# Patient Record
Sex: Female | Born: 1950 | Race: White | Hispanic: No | Marital: Married | Smoking: Never smoker
Health system: Southern US, Community
[De-identification: ages and names within clinical notes are randomized; demographics above are authoritative.]

## PROBLEM LIST (undated history)

## (undated) DIAGNOSIS — R197 Diarrhea, unspecified: Secondary | ICD-10-CM

## (undated) DIAGNOSIS — F329 Major depressive disorder, single episode, unspecified: Secondary | ICD-10-CM

## (undated) DIAGNOSIS — K219 Gastro-esophageal reflux disease without esophagitis: Secondary | ICD-10-CM

## (undated) DIAGNOSIS — E785 Hyperlipidemia, unspecified: Secondary | ICD-10-CM

## (undated) DIAGNOSIS — R059 Cough, unspecified: Secondary | ICD-10-CM

## (undated) DIAGNOSIS — F32A Depression, unspecified: Secondary | ICD-10-CM

## (undated) DIAGNOSIS — I10 Essential (primary) hypertension: Secondary | ICD-10-CM

## (undated) DIAGNOSIS — R05 Cough: Secondary | ICD-10-CM

## (undated) DIAGNOSIS — R42 Dizziness and giddiness: Secondary | ICD-10-CM

## (undated) DIAGNOSIS — I739 Peripheral vascular disease, unspecified: Secondary | ICD-10-CM

## (undated) DIAGNOSIS — F419 Anxiety disorder, unspecified: Secondary | ICD-10-CM

## (undated) HISTORY — DX: Cough: R05

## (undated) HISTORY — DX: Hyperlipidemia, unspecified: E78.5

## (undated) HISTORY — PX: SEPTOPLASTY: SUR1290

## (undated) HISTORY — PX: LUMBAR SPINE SURGERY: SHX701

## (undated) HISTORY — DX: Essential (primary) hypertension: I10

## (undated) HISTORY — DX: Diarrhea, unspecified: R19.7

## (undated) HISTORY — DX: Cough, unspecified: R05.9

## (undated) HISTORY — PX: TOTAL ABDOMINAL HYSTERECTOMY: SHX209

## (undated) HISTORY — DX: Peripheral vascular disease, unspecified: I73.9

## (undated) HISTORY — DX: Gastro-esophageal reflux disease without esophagitis: K21.9

## (undated) HISTORY — PX: RHINOPLASTY: SUR1284

## (undated) HISTORY — PX: CHOLECYSTECTOMY: SHX55

---

## 1999-04-28 ENCOUNTER — Other Ambulatory Visit: Admission: RE | Admit: 1999-04-28 | Discharge: 1999-04-28 | Payer: Self-pay | Admitting: Gynecology

## 2000-05-31 ENCOUNTER — Encounter: Admission: RE | Admit: 2000-05-31 | Discharge: 2000-05-31 | Payer: Self-pay | Admitting: *Deleted

## 2000-07-19 ENCOUNTER — Other Ambulatory Visit: Admission: RE | Admit: 2000-07-19 | Discharge: 2000-07-19 | Payer: Self-pay | Admitting: Gynecology

## 2000-09-24 ENCOUNTER — Inpatient Hospital Stay (HOSPITAL_COMMUNITY): Admission: RE | Admit: 2000-09-24 | Discharge: 2000-09-27 | Payer: Self-pay | Admitting: Gynecology

## 2000-09-24 ENCOUNTER — Encounter (INDEPENDENT_AMBULATORY_CARE_PROVIDER_SITE_OTHER): Payer: Self-pay | Admitting: Specialist

## 2002-07-25 ENCOUNTER — Encounter: Payer: Self-pay | Admitting: Gastroenterology

## 2002-07-25 ENCOUNTER — Encounter: Admission: RE | Admit: 2002-07-25 | Discharge: 2002-07-25 | Payer: Self-pay | Admitting: Internal Medicine

## 2002-08-25 ENCOUNTER — Encounter: Payer: Self-pay | Admitting: General Surgery

## 2002-08-29 ENCOUNTER — Encounter (INDEPENDENT_AMBULATORY_CARE_PROVIDER_SITE_OTHER): Payer: Self-pay | Admitting: *Deleted

## 2002-08-29 ENCOUNTER — Observation Stay (HOSPITAL_COMMUNITY): Admission: RE | Admit: 2002-08-29 | Discharge: 2002-08-30 | Payer: Self-pay | Admitting: General Surgery

## 2002-08-29 ENCOUNTER — Encounter: Payer: Self-pay | Admitting: General Surgery

## 2002-09-02 ENCOUNTER — Encounter: Admission: RE | Admit: 2002-09-02 | Discharge: 2002-09-02 | Payer: Self-pay | Admitting: General Surgery

## 2002-09-02 ENCOUNTER — Encounter: Payer: Self-pay | Admitting: General Surgery

## 2003-04-23 ENCOUNTER — Other Ambulatory Visit: Admission: RE | Admit: 2003-04-23 | Discharge: 2003-04-23 | Payer: Self-pay | Admitting: Gynecology

## 2004-04-19 ENCOUNTER — Ambulatory Visit: Payer: Self-pay | Admitting: Internal Medicine

## 2004-05-17 ENCOUNTER — Ambulatory Visit: Payer: Self-pay | Admitting: Internal Medicine

## 2004-11-28 ENCOUNTER — Ambulatory Visit: Payer: Self-pay | Admitting: Internal Medicine

## 2005-04-21 ENCOUNTER — Ambulatory Visit: Payer: Self-pay | Admitting: Internal Medicine

## 2006-01-16 ENCOUNTER — Ambulatory Visit: Payer: Self-pay | Admitting: Internal Medicine

## 2006-02-15 ENCOUNTER — Ambulatory Visit: Payer: Self-pay | Admitting: Internal Medicine

## 2007-07-05 ENCOUNTER — Ambulatory Visit: Payer: Self-pay | Admitting: Internal Medicine

## 2007-07-05 DIAGNOSIS — J309 Allergic rhinitis, unspecified: Secondary | ICD-10-CM | POA: Insufficient documentation

## 2007-07-05 DIAGNOSIS — J45909 Unspecified asthma, uncomplicated: Secondary | ICD-10-CM | POA: Insufficient documentation

## 2007-07-05 DIAGNOSIS — R059 Cough, unspecified: Secondary | ICD-10-CM | POA: Insufficient documentation

## 2007-07-05 DIAGNOSIS — R05 Cough: Secondary | ICD-10-CM | POA: Insufficient documentation

## 2007-07-05 DIAGNOSIS — L509 Urticaria, unspecified: Secondary | ICD-10-CM | POA: Insufficient documentation

## 2007-07-05 DIAGNOSIS — J209 Acute bronchitis, unspecified: Secondary | ICD-10-CM | POA: Insufficient documentation

## 2008-01-02 ENCOUNTER — Ambulatory Visit: Payer: Self-pay | Admitting: Internal Medicine

## 2008-03-19 ENCOUNTER — Ambulatory Visit: Payer: Self-pay | Admitting: Internal Medicine

## 2008-03-24 ENCOUNTER — Telehealth: Payer: Self-pay | Admitting: Internal Medicine

## 2008-03-26 ENCOUNTER — Telehealth (INDEPENDENT_AMBULATORY_CARE_PROVIDER_SITE_OTHER): Payer: Self-pay | Admitting: *Deleted

## 2008-03-27 ENCOUNTER — Ambulatory Visit: Payer: Self-pay | Admitting: Internal Medicine

## 2008-04-03 ENCOUNTER — Telehealth (INDEPENDENT_AMBULATORY_CARE_PROVIDER_SITE_OTHER): Payer: Self-pay | Admitting: *Deleted

## 2008-10-06 ENCOUNTER — Ambulatory Visit: Payer: Self-pay | Admitting: Internal Medicine

## 2008-10-22 ENCOUNTER — Ambulatory Visit: Payer: Self-pay | Admitting: Internal Medicine

## 2008-10-22 DIAGNOSIS — H6980 Other specified disorders of Eustachian tube, unspecified ear: Secondary | ICD-10-CM | POA: Insufficient documentation

## 2008-10-22 DIAGNOSIS — H6990 Unspecified Eustachian tube disorder, unspecified ear: Secondary | ICD-10-CM | POA: Insufficient documentation

## 2009-08-31 ENCOUNTER — Ambulatory Visit: Payer: Self-pay | Admitting: Internal Medicine

## 2009-09-03 ENCOUNTER — Telehealth (INDEPENDENT_AMBULATORY_CARE_PROVIDER_SITE_OTHER): Payer: Self-pay | Admitting: *Deleted

## 2009-11-01 ENCOUNTER — Telehealth (INDEPENDENT_AMBULATORY_CARE_PROVIDER_SITE_OTHER): Payer: Self-pay | Admitting: *Deleted

## 2010-01-06 ENCOUNTER — Ambulatory Visit: Payer: Self-pay | Admitting: Internal Medicine

## 2010-01-06 DIAGNOSIS — K219 Gastro-esophageal reflux disease without esophagitis: Secondary | ICD-10-CM | POA: Insufficient documentation

## 2010-03-04 ENCOUNTER — Telehealth (INDEPENDENT_AMBULATORY_CARE_PROVIDER_SITE_OTHER): Payer: Self-pay | Admitting: *Deleted

## 2010-03-09 ENCOUNTER — Telehealth: Payer: Self-pay | Admitting: Internal Medicine

## 2010-04-14 NOTE — Progress Notes (Signed)
Summary: talk to nurse  Phone Note Call from Patient Call back at Home Phone [phone]   Caller: Patient Call For: young Summary of Call: States she's not doing any better, would like to have prednisone called in.//[facility] Initial call taken by: Darletta Moll,  September 03, 2009 9:40 AM  Follow-up for Phone Call        Spoke with pt.  She was last seen 6/21 and given rx for amoxcicillin 500 two times a day x 7 days.  She states that her cough is no better- still prod with yellow sputum.  She also c/o the atrovent inhaler makes her throat burn.  She states that she was switched to this d/t xopenex hfs causing nervousness.  She is using the xopenex for neb without any problems.  She wonders if she needs prednisone taper called in.  Please advise, thanks!  Additional Follow-up for Phone Call Additional follow up Details #1::        spoke with pt informed of Prednisone rx -[facility] .Kandice Hams [facility]  September 03, 2009 11:31 AM  Additional Follow-up by: Kandice Hams CMA,  September 03, 2009 11:31 AM    New/Updated Medications: PREDNISONE 10 MG TABS (PREDNISONE) 4 by mouth for 2 days,then 3 by mouth for 2 days,then 2 by mouth for2 days, then 1 by mouth for 2 days Prescriptions: PREDNISONE 10 MG TABS (PREDNISONE) 4 by mouth for 2 days,then 3 by mouth for 2 days,then 2 by mouth for2 days, then 1 by mouth for 2 days  #20 x 0   Entered by:   Kandice Hams CMA   Authorized by:   Waymon Budge MD   Signed by:   Kandice Hams CMA on 09/03/2009   Method used:   Faxed to ...       [facility] [address] (retail)       [address]       [place], [place]  [address]       Ph: [phone] or [phone]       Fax: [phone]   RxID:   [id number]

## 2010-04-14 NOTE — Progress Notes (Signed)
Summary: prescription  Phone Note Call from Patient Call back at Home Phone [phone]   Caller: Patient Call For: DR. Maple Hudson Summary of Call: Pt phoned she was exposed to leaf mold earlier this week and now her face and sinus are swollen she wants to know if Dr. Maple Hudson would call her in some prednisone she has an antibiotic that he gave her at her last visit to fill when she needed it. [facility] [phone] she can be reached at [phone] Initial call taken by: Vedia Coffer,  March 04, 2010 11:46 AM  Follow-up for Phone Call        called spoke with patient who states that leaves/grass were being mowed at her work 3 days ago, and since has been having sinsu pressure, facial swelling.  had rx for zpak given at 10.27.11 ov and had this filled - took first 2 earlier today.  pt is requesting prednisone.  please advise, thanks!  [facility]  Allergies:  1)  ! Flagyl 2)  ! Sulfa 3)  ! Levaquin 4)  ! Augmentin 5)  ! Zantac 6)  ! Nsaids 7)  ! * Nexium Follow-up by: Boone Master CNA/MA,  March 04, 2010 11:54 AM  Additional Follow-up for Phone Call Additional follow up Details #1::        Per CDY, prednisone 10mg  #20 x0.  4 tablets x 2 days, 3 tablets x 2 days, 2 tablets x 2 days, 1 tablet x 2 days.   Gweneth Dimitri RN  March 04, 2010 12:27 PM  Called, spoke with pt.  She is aware of above recs per CY and aware [facility]  She verbalized understanding.  Additional Follow-up by: Gweneth Dimitri RN,  March 04, 2010 12:29 PM    New/Updated Medications: PREDNISONE 10 MG TABS (PREDNISONE) 4 tablets x 2 days, 3 tablets x 2 days, 2 tablets x 2 days, 1 tablet x 2 days. Prescriptions: PREDNISONE 10 MG TABS (PREDNISONE) 4 tablets x 2 days, 3 tablets x 2 days, 2 tablets x 2 days, 1 tablet x 2 days.  #20 x 0   Entered by:   Gweneth Dimitri RN   Authorized by:   Waymon Budge MD   Signed by:   Gweneth Dimitri RN on 03/04/2010   Method used:    Electronically to        [facility] [phone]* (retail)       [address]       [place], [place]  [address]       Ph: [phone] or [phone]       Fax: [phone]   RxID:   [id number]

## 2010-04-14 NOTE — Assessment & Plan Note (Signed)
Summary: lung infection/ mbw   Primary Provider/Referring Provider:  Illa Level  CC:  c/o congestion, cough with production, and yellow took some Amoxicillin that was given by back surgeon. pt says xopenne hfa makes her nervous.  History of Present Illness: 03/19/08 Asthmatic bronchitis, rhinitis 1 month malaise. Past week tighter with cough. Intolerant of cold air. Started her nebulizer- burned her throat. Few green plugs. Burning substernal soreness. Denies fever, GI upset, myyalgias. Has seasonal flu vax. 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 Asks letter for employer.Taking mucinex.   October 06, 2008-- Presents for an acute office visit. Complains of eyes swollen, right ear pain with "drumming", prod cough with small amount of yellow mucus onset 08-24-08 after spinal surgery. Nasal congesiton, worse cough at night. OTC not helping. Denies chest pain, dyspnea, orthopnea, hemoptysis, fever, n/v/d, edema, headache.   10/22/08- Asthmatic bronchitis, rhinitis Had lumbar spine surgery. In late June as she began walking outdoors then, she noted swelling in ears, sense that it comes and goes and burns as it drains. Gluey postnasal drip. She wants something other than cortisone to dry her ears. Omnaris may have irritated her nose. Z pak added to stomach upset. Prednisone also irritated. Discussed probiotics.  August 31, 2009- Asthmatic bronchitis, rhinitis For 2 months has been aware of cough with activity especially outdoors. At  her school graduation she had to leave, feeling faint. Blames postnasal drainage for burning substernal discomfort, esophageal tightness. She took left over amoxacillin two times a day x last 2 days, then took 4 tabs today- 400 mg tabs. She had this anticipating dental work. Blames seasonal pollens and then air pollution, not a cold. Denies GI upset. using loratadine. uses her nebulizer. Even Xopenex HFA makes her jittery but she says she tolerates same med by  nebulizer. Takes back pain med and we discused overlap with cough syrup.    Asthma History    Initial Asthma Severity Rating:    Age range: 12+ years    Symptoms: 0-2 days/week    Nighttime Awakenings: 0-2/month    Interferes w/ normal activity: no limitations    SABA use (not for EIB): 0-2 days/week    Asthma Severity Assessment: Intermittent   Preventive Screening-Counseling & Management  Alcohol-Tobacco     Smoking Status: never  Allergies (verified): 1)  ! Flagyl 2)  ! Sulfa 3)  ! Levaquin 4)  ! Augmentin 5)  ! Zantac 6)  ! Nsaids  Past History:  Past Medical History: Last updated: 07/05/2007 COUGH (ICD-786.2) URTICARIA (ICD-708.9) ASTHMA (ICD-493.90) ALLERGIC RHINITIS (ICD-477.9)    Past Surgical History: Last updated: 03/19/2008 rhinoplasty/ septoplasty lumbar spine surgery x 2 Cholecystectomy Total Abdominal Hysterectomy  Family History: Last updated: 01/02/2008 Brother - hx PE  Social History: Last updated: 03/19/2008 Married Patient never smoked. 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  The patient denies shortness of breath with activity, shortness of breath at rest, coughing up blood, irregular heartbeats, acid heartburn, loss of appetite, weight change, abdominal pain, difficulty swallowing, tooth/dental problems, and headaches.    Vital Signs:  Patient profile:   60 year old female Height:      63.5 inches Weight:      210 pounds BMI:  36.75 O2 Sat:      96 % on Room air Temp:     96.7 degrees F oral Pulse rate:   76 / minute BP sitting:   130 / 70  Vitals Entered By: Kandice Hams (August 31, 2009 1:30 PM)  O2 Flow:  Room air  Physical Exam  Additional Exam:  General: A/Ox3; pleasant and cooperative,  NAD, SKIN: no rash, lesions NODES: no lymphadenopathy HEENT: [place]/AT, EOM- WNL, Conjuctivae- clear, PERRLA, TM-WNL, Nose- clear, Throat- clear and wnl., Mallampati III NECK: Supple w/ fair ROM, JVD- none, normal carotid impulses w/o bruits Thyroid- CHEST: Clear to P&A, unlabored, no cough or wheeze HEART: RRR, no m/g/r heard ABDOMEN: Soft and nl;  ZOX:WRUE, nl pulses, no edema  NEURO: Grossly intact to observation      Impression & Recommendations:  Problem # 1:  ALLERGIC RHINITIS (ICD-477.9)  She is describing a burning postnasal drip, possibly with a tracheitis. We will extend the amoxacillin since she used up her own supply. Her updated medication list for this problem includes:    Alavert 10 Mg Tabs (Loratadine) .Marland Kitchen... Take 1 tablet by mouth once a day as needed    Benadryl 25 Mg Caps (Diphenhydramine hcl) .Marland Kitchen... Take 1 tablet by mouth once a day as needed  Problem # 2:  ASTHMA (ICD-493.90) Not actively wheezing or coughing now. I agreed to refill her cough syrup as long as she is cautious about combining it with back pain meds.  She claims overstimulation even by Xopenex. I will let her try Atrovent as as rescue inhaler. Restart Singulair as a maintenance antiinflammatory. Refill cough syrup for occasional use.  Problem # 3:  URTICARIA (ICD-708.9) No recent rash, hives or puritus  Medications Added to Medication List This Visit: 1)  Atrovent Hfa 17 Mcg/act Aers (Ipratropium bromide hfa) .... 2 puffs four times a day as needed rescue inhaler 2)  Amoxicillin 500 Mg Caps (Amoxicillin) .Marland Kitchen.. 1 bid 3)  Singulair 10 Mg Tabs (Montelukast sodium) .Marland Kitchen.. 1 daily 4)  Hydromet 5-1.5 Mg/9ml Syrp (Hydrocodone-homatropine) .Marland Kitchen.. 1 teaspoon four times a day as needed cough  Other Orders: Est. Patient Level IV ([address])  Patient Instructions: 1)  Please schedule a follow-up appointment in 6 months. 2)  Script for amoxacillin, cough syrup, singulair 3)  Script to change rescue inhaler from  Xopenex to Atrovent HFA Prescriptions: HYDROMET 5-1.5 MG/5ML SYRP (HYDROCODONE-HOMATROPINE) 1 teaspoon four times a day as needed cough  #200 ml x 0   Entered and Authorized by:   Waymon Budge MD   Signed by:   Waymon Budge MD on 08/31/2009   Method used:   Print then Give to Patient   RxID:   [id number] SINGULAIR 10 MG TABS (MONTELUKAST SODIUM) 1 daily  #30 x prn   Entered and Authorized by:   Waymon Budge MD   Signed by:   Waymon Budge MD on 08/31/2009   Method used:   Print then Give to Patient   RxID:   [id number] AMOXICILLIN 500 MG CAPS (AMOXICILLIN) 1 bid  #14 x 0   Entered and Authorized by:   Waymon Budge MD   Signed by:   Waymon Budge MD on 08/31/2009   Method used:   Electronically to        [facility] [phone][facility] (retail)       [address]       [place], [place]  [address]       Ph:  [phone] or [phone]       Fax: [phone]   RxID:   [id number] ATROVENT HFA 17 MCG/ACT AERS (IPRATROPIUM BROMIDE HFA) 2 puffs four times a day as needed rescue inhaler  #1 x prn   Entered and Authorized by:   Waymon Budge MD   Signed by:   Waymon Budge MD on 08/31/2009   Method used:   Electronically to        [facility] [phone]* (retail)       [address]       [place], [place]  [address]       Ph: [phone] or [phone]       Fax: [phone]   RxID:   [id number]

## 2010-04-14 NOTE — Progress Notes (Signed)
Summary: refill-  Phone Note Call from Patient Call back at [phone]   Caller: Patient Call For: young Summary of Call: needs refill on promethazine codein syrup hit this has falling off of her refill list says it helps with motion sickness  [facility] [phone] Initial call taken by: Lacinda Axon,  March 09, 2010 12:09 PM  Follow-up for Phone Call        lmomtcb Vernie Murders  March 09, 2010 12:17 PM   Additional Follow-up for Phone Call Additional follow up Details #1::        Patient returned Leslie's call and will await call back to cell phone [phone]. Additional Follow-up by: Leonette Monarch,  March 09, 2010 12:46 PM    Additional Follow-up for Phone Call Additional follow up Details #2::    Pt c/o motion sickness when driving in car. Requesting Prometh Cod to be called in to pharmacy. States CY has prescribed this in the past for the same thing and is the only thing that helps with it. Please advise. Thanks! Zackery Barefoot CMA  March 10, 2010 12:28 PM  Call back [phone], [facility] Allergies:  1)  ! Flagyl 2)  ! Sulfa 3)  ! Levaquin 4)  ! Augmentin 5)  ! Zantac 6)  ! Nsaids 7)  ! * Nexium  Additional Follow-up for Phone Call Additional follow up Details #3:: Details for Additional Follow-up Action Taken: ok to give prometh/ codeine cough syrup 200 ml, no ref 1 tespoon four times a day as needed  Additional Follow-up by: Waymon Budge MD,  March 10, 2010 1:25 PM  New/Updated Medications: PROMETHAZINE-CODEINE 6.25-10 MG/5ML SYRP (PROMETHAZINE-CODEINE) 1 teaspoon every 4 hours as needed Prescriptions: PROMETHAZINE-CODEINE 6.25-10 MG/5ML SYRP (PROMETHAZINE-CODEINE) 1 teaspoon every 4 hours as needed  #212ml x 0   Entered by:   Carron Curie CMA   Authorized by:   Waymon Budge MD   Signed by:   Carron Curie CMA on 03/10/2010   Method used:   Telephoned to ...       [facility] [address] (retail)       [address]       [place], [place]  [address]       Ph: [phone] or [phone]       Fax: [phone]   RxID:   [id number]  refill sent. pt aware.Carron Curie CMA  March 10, 2010 1:41 PM

## 2010-04-14 NOTE — Assessment & Plan Note (Signed)
Summary: allergies///kp   Primary Provider/Referring Provider:  Illa Level  CC:  Acute visit-Sore throat, chest is sore, and cough-deep and dry..  History of Present Illness: 10/22/08- Asthmatic bronchitis, rhinitis Had lumbar spine surgery. In late June as she began walking outdoors then, she noted swelling in ears, sense that it comes and goes and burns as it drains. Gluey postnasal drip. She wants something other than cortisone to dry her ears. Omnaris may have irritated her nose. Z pak added to stomach upset. Prednisone also irritated. Discussed probiotics.  August 31, 2009- Asthmatic bronchitis, rhinitis For 2 months has been aware of cough with activity especially outdoors. At  her school graduation she had to leave, feeling faint. Blames postnasal drainage for burning substernal discomfort, esophageal tightness. She took left over amoxacillin two times a day x last 2 days, then took 4 tabs today- 400 mg tabs. She had this anticipating dental work. Blames seasonal pollens and then air pollution, not a cold. Denies GI upset. using loratadine. uses her nebulizer. Even Xopenex HFA makes her jittery but she says she tolerates same med by nebulizer. Takes back pain med and we discused overlap with cough syrup.  January 06, 2010- Asthmatic bronchitis, rhinitis, GERD Nurse-CC: Acute visit-Sore throat,chest is sore, cough-deep and dry. On and off for 6 weeks has felt heartburn, cough. Has taken Nexium  but says it cause vertigo. Went to GI who told her she needed reflux surgery she doesn't want. Thinks she has both allergy and reflux. Denies nasal stuffiness, discharge or, sneeze. Wheezey this morning. Frequently wakes with sudden choking. Has to be careful w/ Sudafed.    Asthma History    Asthma Control Assessment:    Age range: 12+ years    Symptoms: 0-2 days/week    Nighttime Awakenings: 0-2/month    Interferes w/ normal activity: no limitations    SABA use (not for EIB): 0-2 days/week  Asthma Control Assessment: Well Controlled   Preventive Screening-Counseling & Management  Alcohol-Tobacco     Smoking Status: never  Current Medications (verified): 1)  Alavert 10 Mg  Tabs (Loratadine) .... Take 1 Tablet By Mouth Once A Day As Needed 2)  Benadryl 25 Mg Caps (Diphenhydramine Hcl) .... Take 1 Tablet By Mouth Once A Day As Needed 3)  Cholestyramine 4 Gm Pack (Cholestyramine) .Marland Kitchen.. 1 Scoop Per Day 4)  Xopenex 0.63 Mg/77ml Nebu (Levalbuterol Hcl) .Marland Kitchen.. 1 Neb Three Times A Day As Needed 5)  Atrovent Hfa 17 Mcg/act Aers (Ipratropium Bromide Hfa) .... 2 Puffs Four Times A Day As Needed Rescue Inhaler 6)  Singulair 10 Mg Tabs (Montelukast Sodium) .Marland Kitchen.. 1 Daily  Allergies: 1)  ! Flagyl 2)  ! Sulfa 3)  ! Levaquin 4)  ! Augmentin 5)  ! Zantac 6)  ! Nsaids 7)  ! 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  The patient denies shortness of breath with activity, shortness of breath  at rest, productive cough, coughing up blood, chest pain, irregular heartbeats, loss of appetite, weight change, abdominal pain, difficulty swallowing, sore throat, tooth/dental problems, headaches, itching, ear ache, rash, change in color of mucus, and fever.    Vital Signs:  Patient profile:   60 year old female Height:      63.5 inches Weight:       214 pounds BMI:     37.45 O2 Sat:      95 % on Room air Pulse rate:   122 / minute BP sitting:   130 / 78  (left arm) Cuff size:   regular  Vitals Entered By: Reynaldo Minium CMA (January 06, 2010 4:14 PM)  O2 Flow:  Room air CC: Acute visit-Sore throat,chest is sore, cough-deep and dry.   Physical Exam  Additional Exam:  General: A/Ox3; pleasant and cooperative, NAD, SKIN: no rash, lesions NODES: no lymphadenopathy HEENT: [place]/AT, EOM- WNL, Conjuctivae- clear, PERRLA, TM-WNL, Nose- clear, Throat- clear and wnl., Mallampati III NECK: Supple w/ fair ROM, JVD- none, normal carotid impulses w/o bruits Thyroid- CHEST: Clear to P&A, unlabored, no cough or wheeze HEART: RRR, no m/g/r heard ABDOMEN: Soft and nl;  ZOX:WRUE, nl pulses, no edema  NEURO: Grossly intact to observation      Impression & Recommendations:  Problem # 1:  GERD (ICD-530.81)  Significant GERD. She has her own observations about what she can take. I will give sample Dexilant- 1 daily. If helpful she might be able to take otc zegerid. She asks to wait on flu shot- discussed.  The following medications were removed from the medication list:    Nexium 40 Mg Cpdr (Esomeprazole magnesium) .Marland Kitchen... 1 by mouth once daily as needed  Problem # 2:  BRONCHITIS, ACUTE (ICD-466.0) Recent bronchits. Likely viral. DDX includes reflux and aspiration.  She feels her nebulizer and our depo will help. The following medications were removed from the medication list:    Amoxicillin 500 Mg Caps (Amoxicillin) .Marland Kitchen... 1 bid    Hydromet 5-1.5 Mg/33ml Syrp (Hydrocodone-homatropine) .Marland Kitchen... 1 teaspoon four times a day as needed cough Her updated medication list for this problem includes:    Xopenex 0.63 Mg/75ml Nebu (Levalbuterol hcl) .Marland Kitchen... 1 neb three times a day as needed    Atrovent Hfa 17 Mcg/act Aers (Ipratropium bromide hfa) .Marland Kitchen... 2 puffs four times a day as needed rescue inhaler    Singulair 10 Mg Tabs (Montelukast sodium) .Marland Kitchen... 1  daily    Zithromax Z-pak 250 Mg Tabs (Azithromycin) .Marland Kitchen... 2 today then one daily  Medications Added to Medication List This Visit: 1)  Zithromax Z-pak 250 Mg Tabs (Azithromycin) .... 2 today then one daily  Other Orders: Est. Patient Level IV (45409) Depo- Medrol 80mg  (J1040) Admin of Therapeutic Inj  intramuscular or subcutaneous (81191)  Patient Instructions: 1)  Please schedule a follow-up appointment in 1 year. Call sooner as needed 2)  Try samples Dexilant 1 daily 3)  otc Zegerid might be similar if you like Dexilant. 4)  Depo 80 5)  script to hold for Zpak Prescriptions: ZITHROMAX Z-PAK 250 MG TABS (AZITHROMYCIN) 2 today then one daily  #1 pak x 0   Entered and Authorized by:   Waymon Budge MD   Signed by:   Waymon Budge MD on 01/06/2010   Method used:   Print then Give to Patient   RxID:   [id number]      Medication Administration  Injection # 1:    Medication: Depo- Medrol 80mg   Diagnosis: BRONCHITIS, ACUTE (ICD-466.0)    Route: IM    Site: RUOQ gluteus    Exp Date: 08/2012    Lot #: [id number]    Mfr: Pharmacia    Patient tolerated injection without complications    Given by: Randell Loop CMA (January 06, 2010 5:04 PM)  Orders Added: 1)  Est. Patient Level IV [78295] 2)  Depo- Medrol 80mg  [J1040] 3)  Admin of Therapeutic Inj  intramuscular or subcutaneous [62130]

## 2010-04-14 NOTE — Progress Notes (Signed)
Summary: refill on Nexium -done  Phone Note Call from Patient Call back at Home Phone [phone]   Caller: Patient Call For: young Reason for Call: Refill Medication Summary of Call: Needs refill on nexium 40mg .//[facility] Initial call taken by: Darletta Moll,  November 01, 2009 10:32 AM  Follow-up for Phone Call        Cy,  looks like this was last prescribed for pt by TP 07-05-2007.  Do you want to ok refills or refer pt to her PCP.  Thanks.  Aundra Millet Reynolds LPN  November 01, 2009 10:54 AM   Additional Follow-up for Phone Call Additional follow up Details #1::        I would prefer that her PCP deal with maintenance GI meds, Thanks Additional Follow-up by: Waymon Budge MD,  November 01, 2009 1:44 PM    Additional Follow-up for Phone Call Additional follow up Details #2::    LMOMTCB Vernie Murders  November 01, 2009 1:48 PM  [facility] Gweneth Dimitri RN  November 01, 2009 5:15 PM   Additional Follow-up for Phone Call Additional follow up Details #3:: Details for Additional Follow-up Action Taken: pt returning call about nexium.  [facility] Crystal Yetta Barre RN  November 02, 2009 3:56 PM  Spoke with pt and advised Dr Maple Hudson prefers she have PCP or GI MD refill her nexium.  She states that she was a pt of Dr Recardo Evangelist and since he has retired she has no PCP as of yet and also has no GI doc but plans to see one soon.  She asks if Dr Maple Hudson would ming okaying this x 1 only until she gets appt's with PCP or GI.  Pls advise thanks! Vernie Murders  November 03, 2009 9:08 AM  Ok-  Waymon Budge MD  November 03, 2009 9:12 AM  OK- .sign  Refill was sent to pharm- spoke with pt and notified this was done. Vernie Murders  November 03, 2009 9:16 AM  Additional Follow-up by: Rickard Patience,  November 02, 2009 8:30 AM  New/Updated Medications: NEXIUM 40 MG CPDR (ESOMEPRAZOLE MAGNESIUM) 1 by mouth once daily as needed Prescriptions: NEXIUM 40 MG CPDR (ESOMEPRAZOLE MAGNESIUM) 1 by mouth once daily as needed   #30 x 1   Entered by:   Vernie Murders   Authorized by:   Waymon Budge MD   Signed by:   Vernie Murders on 11/03/2009   Method used:   [facility]        [facility] [phone][facility] (retail)       [address]       [place], [place]  [address]       Ph: [phone] or [phone]       Fax: [phone]   RxID:   [id number]

## 2010-04-18 ENCOUNTER — Telehealth (INDEPENDENT_AMBULATORY_CARE_PROVIDER_SITE_OTHER): Payer: Self-pay | Admitting: *Deleted

## 2010-04-19 ENCOUNTER — Encounter: Payer: Self-pay | Admitting: Internal Medicine

## 2010-04-19 ENCOUNTER — Ambulatory Visit (INDEPENDENT_AMBULATORY_CARE_PROVIDER_SITE_OTHER): Payer: BC Managed Care – PPO | Admitting: Internal Medicine

## 2010-04-19 DIAGNOSIS — J209 Acute bronchitis, unspecified: Secondary | ICD-10-CM

## 2010-04-19 DIAGNOSIS — H6980 Other specified disorders of Eustachian tube, unspecified ear: Secondary | ICD-10-CM

## 2010-04-19 DIAGNOSIS — R059 Cough, unspecified: Secondary | ICD-10-CM

## 2010-04-19 DIAGNOSIS — R05 Cough: Secondary | ICD-10-CM

## 2010-04-27 ENCOUNTER — Telehealth (INDEPENDENT_AMBULATORY_CARE_PROVIDER_SITE_OTHER): Payer: Self-pay | Admitting: *Deleted

## 2010-04-28 ENCOUNTER — Telehealth (INDEPENDENT_AMBULATORY_CARE_PROVIDER_SITE_OTHER): Payer: Self-pay | Admitting: *Deleted

## 2010-04-28 NOTE — Assessment & Plan Note (Signed)
Summary: cough//lmr   Primary Provider/Referring Provider:  Illa Level  CC:  Acute visit-dry cough-approx 3 weeks; not getting any better.Marland Kitchen  History of Present Illness:  August 31, 2009- Asthmatic bronchitis, rhinitis For 2 months has been aware of cough with activity especially outdoors. At  her school graduation she had to leave, feeling faint. Blames postnasal drainage for burning substernal discomfort, esophageal tightness. She took left over amoxacillin two times a day x last 2 days, then took 4 tabs today- 400 mg tabs. She had this anticipating dental work. Blames seasonal pollens and then air pollution, not a cold. Denies GI upset. using loratadine. uses her nebulizer. Even Xopenex HFA makes her jittery but she says she tolerates same med by nebulizer. Takes back pain med and we discused overlap with cough syrup.  January 06, 2010- Asthmatic bronchitis, rhinitis, GERD Nurse-CC: Acute visit-Sore throat,chest is sore, cough-deep and dry. On and off for 6 weeks has felt heartburn, cough. Has taken Nexium  but says it cause vertigo. Went to GI who told her she needed reflux surgery she doesn't want. Thinks she has both allergy and reflux. Denies nasal stuffiness, discharge or, sneeze. Wheezey this morning. Frequently wakes with sudden choking. Has to be careful w/ Sudafed.   April 19, 2010- Asthmatic bronchitis, rhinitis, GERD Nurse-CC: Acute visit-dry cough-approx 3 weeks; not getting any better. We gave Zpak in October, then prednisone taper for ? allergic episode blamed on leaf mold. 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 She has had more illness this winter. Had vertigo and saw Dr Pollyann Kennedy who thought she had chronic vascular migraine and referred her to neurology. Now had definite cold with persistent cough since.     Asthma History    Asthma Control Assessment:    Age range: 12+ years    Symptoms: 0-2 days/week    Nighttime Awakenings: 0-2/month    Interferes w/ normal activity: no  limitations    SABA use (not for EIB): 0-2 days/week    Asthma Control Assessment: Well Controlled   Preventive Screening-Counseling & Management  Alcohol-Tobacco     Smoking Status: never  Current Medications (verified): 1)  Alavert 10 Mg  Tabs (Loratadine) .... Take 1 Tablet By Mouth Once A Day As Needed 2)  Benadryl 25 Mg Caps (Diphenhydramine Hcl) .... Take 1 Tablet By Mouth Once A Day As Needed 3)  Cholestyramine 4 Gm Pack (Cholestyramine) .Marland Kitchen.. 1 Scoop Per Day 4)  Xopenex 0.63 Mg/44ml Nebu (Levalbuterol Hcl) .Marland Kitchen.. 1 Neb Three Times A Day As Needed 5)  Atrovent Hfa 17 Mcg/act Aers (Ipratropium Bromide Hfa) .... 2 Puffs Four Times A Day As Needed Rescue Inhaler  Allergies (verified): 1)  ! Flagyl 2)  ! Sulfa 3)  ! Levaquin 4)  ! Augmentin 5)  ! Zantac 6)  ! Nsaids 7)  ! * Nexium  Past History:  Past Medical History: Last updated: 01/06/2010 COUGH (ICD-786.2) URTICARIA (ICD-708.9) ASTHMA (ICD-493.90) ALLERGIC RHINITIS (ICD-477.9)  G E R D  Past Surgical History: Last updated: 03/19/2008 rhinoplasty/ septoplasty lumbar spine surgery x 2 Cholecystectomy Total Abdominal Hysterectomy  Family History: Last updated: 01/02/2008 Brother - hx PE  Social History: Last updated: 03/19/2008 Married Patient never smoked. 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  The patient denies shortness of breath  with activity, shortness of breath at rest, productive cough, coughing up blood, chest pain, irregular heartbeats, acid heartburn, indigestion, loss of appetite, weight change, abdominal pain, difficulty swallowing, and sore throat.    Vital Signs:  Patient profile:   60 year old female Height:      63.5 inches Weight:      217 pounds BMI:     37.97 O2 Sat:      94 % on  Room air Pulse rate:   110 / minute BP sitting:   120 / 78  (left arm) Cuff size:   large  Vitals Entered By: Reynaldo Minium CMA (April 19, 2010 2:50 PM)  O2 Flow:  Room air CC: Acute visit-dry cough-approx 3 weeks; not getting any better.   Physical Exam  Additional Exam:  General: A/Ox3; pleasant and cooperative, NAD, SKIN: no rash, lesions NODES: no lymphadenopathy HEENT: [place]/AT, EOM- WNL, Conjuctivae- clear, PERRLA, TM-WNL, Nose- clear, Throat- clear and wnl., Mallampati III NECK: Supple w/ fair ROM, JVD- none, normal carotid impulses w/o bruits Thyroid- CHEST: Clear to P&A, unlabored, no cough or wheeze HEART: RRR, no m/g/r heard ABDOMEN: Soft and nl;  ZOX:WRUE, nl pulses, no edema  NEURO: Grossly intact to observation      Impression & Recommendations:  Problem # 1:  BRONCHITIS, ACUTE (ICD-466.0) Post viral bronchitis.  Will give depo and cough syrup She will see neurologist about her vertigo The following medications were removed from the medication list:    Singulair 10 Mg Tabs (Montelukast sodium) .Marland Kitchen... 1 daily    Zithromax Z-pak 250 Mg Tabs (Azithromycin) .Marland Kitchen... 2 today then one daily    Promethazine-codeine 6.25-10 Mg/74ml Syrp (Promethazine-codeine) .Marland Kitchen... 1 teaspoon every 4 hours as needed Her updated medication list for this problem includes:    Xopenex 0.63 Mg/32ml Nebu (Levalbuterol hcl) .Marland Kitchen... 1 neb three times a day as needed    Atrovent Hfa 17 Mcg/act Aers (Ipratropium bromide hfa) .Marland Kitchen... 2 puffs four times a day as needed rescue inhaler    Hydrocodone-homatropine 5-1.5 Mg/69ml Syrp (Hydrocodone-homatropine) .Marland Kitchen... 1 teaspoon four times a day as needed cough  Problem # 2:  EUSTACHIAN TUBE DYSFUNCTION (ICD-381.81) Vertigo, possibly a migraine variant. She will folow through with nerurology referral by Dr Pollyann Kennedy on this,   Problem # 3:  ALLERGIC RHINITIS (ICD-477.9) She has had a series of winter virus infections. Discussing options, we hope listed meds will  get her through the rest of the winter.  Her updated medication list for this problem includes:    Alavert 10 Mg Tabs (Loratadine) .Marland Kitchen... Take 1 tablet by mouth once a day as needed    Benadryl 25 Mg Caps (Diphenhydramine hcl) .Marland Kitchen... Take 1 tablet by mouth once a day as needed  Medications Added to Medication List This Visit: 1)  Hydrocodone-homatropine 5-1.5 Mg/81ml Syrp (Hydrocodone-homatropine) .Marland Kitchen.. 1 teaspoon four times a day as needed cough  Other Orders: Est. Patient Level III (45409) Admin of Therapeutic Inj  intramuscular or subcutaneous (81191) Depo- Medrol 80mg  (J1040)  Patient Instructions: 1)  Keep scheduled appointment or call sooner as needed 2)  depo 80 3)  script for cough syrup 4)  I will be interested in learning what you find helps the vertigo.  Prescriptions: HYDROCODONE-HOMATROPINE 5-1.5 MG/5ML SYRP (HYDROCODONE-HOMATROPINE) 1 teaspoon four times a day as needed cough  #200 ml x 0   Entered and Authorized by:   Waymon Budge MD   Signed by:   Waymon Budge MD on 04/19/2010  Method used:   Print then Give to Patient   RxID:   [id number]    Medication Administration  Injection # 1:    Medication: Depo- Medrol 80mg     Diagnosis: BRONCHITIS, ACUTE (ICD-466.0)    Route: SQ    Site: RUOQ gluteus    Exp Date: 09/2012    Lot #: [id number]    Mfr: Pharmacia    Patient tolerated injection without complications    Given by: Reynaldo Minium CMA (April 19, 2010 3:36 PM)  Orders Added: 1)  Est. Patient Level III [14782] 2)  Admin of Therapeutic Inj  intramuscular or subcutaneous [96372] 3)  Depo- Medrol 80mg  [J1040]

## 2010-04-28 NOTE — Progress Notes (Signed)
Summary: cough-LMTCB x 1  Phone Note Call from Patient Call back at Work Phone [phone]   Caller: Patient Call For: young Summary of Call: Pt c/o of coughing x3 weeks possible bronchitis wants to be seen this week but states she needs an early morning or late evening pls advise. Initial call taken by: Darletta Moll,  April 18, 2010 8:19 AM  Follow-up for Phone Call        [facility] Vernie Murders  April 18, 2010 9:07 AM   Additional Follow-up for Phone Call Additional follow up Details #1::        Spoke with pt.  She is requesting appt for cough x 3 wks- neck feels swollen and she is also c/o rib pain from coughing so much.  I offered ov with CDY today and she states that she is unable to leave work.  She states can come at 2:45 pm tommorrow.  I advised will sched appt for then and go to UC/ED if she needs to be seen sooner.Pt verbalized understanding.  Additional Follow-up by: Vernie Murders,  April 18, 2010 9:35 AM

## 2010-05-04 NOTE — Progress Notes (Signed)
Summary: needs name of headache doc they discussed at appt > [name]  Phone Note Call from Patient   Caller: Patient Call For: young Summary of Call: Patient phoned at her last visit Dr Maple Hudson gave her a name for a headache doc but she didnt write the name down and would like for the nurse to call and leave the info on her cell phone. Her cell number is [phone] Initial call taken by: Vedia Coffer,  April 27, 2010 3:07 PM  Follow-up for Phone Call        no mention of a specific name in ov note.  LMOM to inform pt of this and that CDY is not in office this afternoon so we will call her back tomorrow.  encouraged pt to call w/ any questions/concerns. Boone Master CNA/MA  April 27, 2010 4:09 PM   per CDY: neurologist is Dr. [name].  called pt, LMOM w/ physician's name as she requested.  encourgaed to call w/ any other questions/concerns.  Follow-up by: Boone Master CNA/MA,  April 27, 2010 4:28 PM

## 2010-05-04 NOTE — Progress Notes (Signed)
Summary: prescription for antibiotic  Phone Note Call from Patient   Caller: Patient Call For: YOUNG Summary of Call: Patient phoned stated that she saw Dr Maple Hudson last week and he gave her a prednisone injection and she thought that she was getting better but she started coughing again it was dry this morning but is less dry now. she wants to know if an antibiotic can be called into [facility] [address]. Patient can reached at [phone] or home [phone] Initial call taken by: Vedia Coffer,  April 28, 2010 4:21 PM  Follow-up for Phone Call        Spoke with pt and she states she saw CY on 04-19-10 and was given depo injection and she states this mad eher feel better, cough improved for a few days. But now x 2 days she has had increased cough that is mostly a dry cough and increased PND. Pt is requesting an antibiotic. Please advise. Carron Curie CMA  April 28, 2010 4:55 PM allergies: 1)  ! Flagyl 2)  ! Sulfa 3)  ! Levaquin 4)  ! Augmentin 5)  ! Zantac 6)  ! Nsaids 7)  ! * Nexium  Additional Follow-up for Phone Call Additional follow up Details #1::        Per CDY-okay to give doxycycline 100mg  #8 take 2 today then 1 daily until gone NO REFILLS.Reynaldo Minium CMA  April 28, 2010 5:09 PM     Additional Follow-up for Phone Call Additional follow up Details #2::    Pt aware that Rx was sent to pharmacy.Reynaldo Minium CMA  April 28, 2010 5:17 PM   New/Updated Medications: DOXYCYCLINE HYCLATE 100 MG TABS (DOXYCYCLINE HYCLATE) take 2 today then 1 daily until gone Prescriptions: DOXYCYCLINE HYCLATE 100 MG TABS (DOXYCYCLINE HYCLATE) take 2 today then 1 daily until gone  #8 x 0   Entered by:   Reynaldo Minium CMA   Authorized by:   Waymon Budge MD   Signed by:   Reynaldo Minium CMA on 04/28/2010   Method used:   [facility]        [phone][facility] (retail)       [address]       [place], [place]  [address]       Ph: [phone] or [phone]       Fax:  [phone]   RxID:   [id number]

## 2010-05-23 ENCOUNTER — Telehealth (INDEPENDENT_AMBULATORY_CARE_PROVIDER_SITE_OTHER): Payer: Self-pay | Admitting: *Deleted

## 2010-05-31 NOTE — Progress Notes (Signed)
Summary: pt would like to be seen today-LMTCB x 1  Phone Note Call from Patient   Caller: Patient Call For: young Summary of Call: Miranda [name] phoned she is having difficultly breathing and would like to be seen today. There is not anything available. Please advise patient if she can be worked in. Patient can be reached at [phone] Initial call taken by: Vedia Coffer,  May 23, 2010 8:44 AM  Follow-up for Phone Call        LMTCBx1. Carron Curie CMA  May 23, 2010 9:16 AM  Spoke with pt.  She is c/o increased SOB that is "ongoing".  She states that she is no better since last seen on 04/19/10.  She is also c/o "constant cough" that is dry and so violent at times until the point where she vomits.  She states that she is unaware of fever, but has had hot flashes  Wants appt this wk.  Nothing available.  She is thinking she could benefit frmo starting low dose maint prednisone.  Pls advise thanks Allergies (verified):  1)  ! Flagyl 2)  ! Sulfa 3)  ! Levaquin 4)  ! Augmentin 5)  ! Zantac 6)  ! Nsaids 7)  ! * Nexium  Follow-up by: Vernie Murders,  May 23, 2010 9:28 AM  Additional Follow-up for Phone Call Additional follow up Details #1::        Per CDY-ok to give Prednisone 10mg  #21 take 1 once daily x 3 weeks; no refills.Reynaldo Minium CMA  May 23, 2010 10:44 AM   LMOMTCB Vernie Murders  May 23, 2010 10:52 AM [facility]  May 23, 2010 4:40 PM     Additional Follow-up for Phone Call Additional follow up Details #2::    Spoke with pt and notified of recs per CDY.  Pt verbalized understanding and rx was sent to pharm. Follow-up by: Vernie Murders,  May 24, 2010 10:47 AM  New/Updated Medications: PREDNISONE 10 MG TABS (PREDNISONE) 1 once daily until gone Prescriptions: PREDNISONE 10 MG TABS (PREDNISONE) 1 once daily until gone  #21 x 0   Entered by:   Vernie Murders   Authorized by:   Waymon Budge MD   Signed by:   Vernie Murders on 05/24/2010  Method used:   [facility]        [facility] [phone][facility] (retail)       [address]       [place], [place]  [address]       Ph: [phone] or [phone]       Fax: [phone]   RxID:   [id number]

## 2010-06-27 ENCOUNTER — Other Ambulatory Visit: Payer: Self-pay | Admitting: Neurology

## 2010-06-27 DIAGNOSIS — R2681 Unsteadiness on feet: Secondary | ICD-10-CM

## 2010-06-27 DIAGNOSIS — R42 Dizziness and giddiness: Secondary | ICD-10-CM

## 2010-06-27 DIAGNOSIS — R51 Headache: Secondary | ICD-10-CM

## 2010-06-28 ENCOUNTER — Ambulatory Visit
Admission: RE | Admit: 2010-06-28 | Discharge: 2010-06-28 | Disposition: A | Discharge status: Home / Self Care | Payer: BLUE CROSS/BLUE SHIELD | Source: Ambulatory Visit | Attending: Neurology | Admitting: Neurology

## 2010-06-28 DIAGNOSIS — R42 Dizziness and giddiness: Secondary | ICD-10-CM

## 2010-06-28 DIAGNOSIS — R51 Headache: Secondary | ICD-10-CM

## 2010-06-28 DIAGNOSIS — R2681 Unsteadiness on feet: Secondary | ICD-10-CM

## 2010-06-28 MED ORDER — GADOBENATE DIMEGLUMINE 529 MG/ML IV SOLN
15.0000 mL | Freq: Once | INTRAVENOUS | Status: AC | PRN
  Administered 2010-06-28: 15 mL via INTRAVENOUS

## 2010-07-11 ENCOUNTER — Encounter: Payer: Self-pay | Admitting: Cardiology

## 2010-07-15 ENCOUNTER — Encounter: Payer: Self-pay | Admitting: Cardiology

## 2010-07-15 ENCOUNTER — Ambulatory Visit (INDEPENDENT_AMBULATORY_CARE_PROVIDER_SITE_OTHER): Payer: BLUE CROSS/BLUE SHIELD | Admitting: Cardiology

## 2010-07-15 VITALS — BP 121/86 | HR 102 | Ht 63.0 in | Wt 204.0 lb

## 2010-07-15 DIAGNOSIS — R002 Palpitations: Secondary | ICD-10-CM

## 2010-07-15 DIAGNOSIS — R42 Dizziness and giddiness: Secondary | ICD-10-CM

## 2010-07-15 DIAGNOSIS — I10 Essential (primary) hypertension: Secondary | ICD-10-CM

## 2010-07-15 MED ORDER — ATENOLOL 25 MG PO TABS: 25.0000 mg | ORAL_TABLET | Freq: Every day | ORAL | Status: DC

## 2010-07-15 NOTE — Patient Instructions (Signed)
Start Atenolol 25mg  daily.  Schedule an appointment for an echocardiogram.  Fasting lab when you have the echocardiogram--lipid profile/TSH/Free T4/BNP  780.4  401.9  Schedule an appointment for a 3 week event monitor.  Schedule an appointment to see Dr Shirlee Latch in 1 month.

## 2010-07-17 DIAGNOSIS — I10 Essential (primary) hypertension: Secondary | ICD-10-CM | POA: Insufficient documentation

## 2010-07-17 DIAGNOSIS — R42 Dizziness and giddiness: Secondary | ICD-10-CM | POA: Insufficient documentation

## 2010-07-17 NOTE — Assessment & Plan Note (Signed)
Patient's symptoms seem most like vertigo, possibly in association with migraine.  They seem less likely to be due to arrhythmia.  However, to rule out cardiac cause, I will have her do a 3 week event monitor.  I will also get an echo to make sure that her heart is structurally normal. I will check TSH and free T4 as well as BNP.

## 2010-07-17 NOTE — Assessment & Plan Note (Addendum)
I am going to have the patient start atenolol 25 mg daily for elevated BP.  This may also provide some prophylaxis against migraine occurrence.   Patient has not had recent lipids, so I will go ahead and draw lipids/LFTs.

## 2010-07-17 NOTE — Progress Notes (Signed)
PCP: Dr. Luciana Axe  60 yo with history of migraines presents for cardiology evaluation of "dizzy spells."  Patient states that she is mildly dizzy "all the time."  However, superimposed on this low-level background of dizziness, she has had about 6 episodes in the last year of very severe "spinning" associated with nausea.  This sounds like vertigo.  These vertigo episodes will last for about 15-30 minutes.  She does not pass out during the episodes, and denies prior syncope.  Sometimes she will feel her heart pound but not always. She has seen a headache specialist who thinks that her spells are likely due to vertigo, possibly in association with migraines.  She was tried on Topamax for migraine prophylaxis but could not tolerate it.  She was not orthostatic in the office today.  Finally, her blood pressure frequently runs high, up to SBP 160s, when she checks it at home.  It is ok in the office today.    ECG: NSR, nonspecific anterior T wave flattening  PMH: 1. Asthma 2. Panic attacks 3. Migraines  4. Tinnitus 5. Vertigo 6. TAH/BSO 7. GERD 8. Low back pain, history of lumbar fusion  SH: Lives in [place].  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 Married, 1 child.   FH: Brother with PE.  No premature CAD.   ROS: All systems reviewed and negative except as per HPI.   Current Outpatient Prescriptions  Medication Sig Dispense Refill  . cholestyramine (QUESTRAN) 4 G packet 1 scoop per day.       Marland Kitchen HYDROcodone-homatropine (HYCODAN) 5-1.5 MG/5ML syrup 1 teaspoon four times a day as needed for cough       . ipratropium (ATROVENT HFA) 17 MCG/ACT inhaler 2 puffs four times a day as needed rescue inhaler       . levalbuterol (XOPENEX HFA) 45 MCG/ACT inhaler Inhale 2 puffs into the lungs 4 (four) times daily as needed.        . levalbuterol (XOPENEX) 0.63 MG/3ML nebulizer solution Take 1 ampule by nebulization 3 (three) times daily as needed.        .  loratadine (ALAVERT) 10 MG tablet Take 10 mg by mouth daily as needed.        Marland Kitchen atenolol (TENORMIN) 25 MG tablet Take 1 tablet (25 mg total) by mouth daily.  30 tablet  6  . diphenhydrAMINE (BENADRYL) 25 mg capsule Take 25 mg by mouth as needed.         BP 121/86  Pulse 102  Ht 5\' 3"  (1.6 m)  Wt 204 lb (92.534 kg)  BMI 36.14 kg/m2 General: NAD Neck: No JVD, no thyromegaly or thyroid nodule.  Lungs: Clear to auscultation bilaterally with normal respiratory effort. CV: Nondisplaced PMI.  Heart regular S1/S2, no S3/S4, no murmur.  No peripheral edema.  No carotid bruit.  Normal pedal pulses.  Abdomen: Soft, nontender, no hepatosplenomegaly, no distention.  Skin: Intact without lesions or rashes.  Neurologic: Alert and oriented x 3.  Psych: Normal affect. Extremities: No clubbing or cyanosis.  HEENT: Normal.

## 2010-07-27 ENCOUNTER — Other Ambulatory Visit (INDEPENDENT_AMBULATORY_CARE_PROVIDER_SITE_OTHER): Payer: BC Managed Care – PPO | Admitting: Cardiology

## 2010-07-27 ENCOUNTER — Ambulatory Visit (HOSPITAL_COMMUNITY): Payer: BC Managed Care – PPO | Attending: Cardiology | Admitting: Radiology

## 2010-07-27 ENCOUNTER — Other Ambulatory Visit (INDEPENDENT_AMBULATORY_CARE_PROVIDER_SITE_OTHER): Payer: BC Managed Care – PPO | Admitting: *Deleted

## 2010-07-27 ENCOUNTER — Encounter (INDEPENDENT_AMBULATORY_CARE_PROVIDER_SITE_OTHER): Payer: BC Managed Care – PPO

## 2010-07-27 DIAGNOSIS — R42 Dizziness and giddiness: Secondary | ICD-10-CM

## 2010-07-27 DIAGNOSIS — I10 Essential (primary) hypertension: Secondary | ICD-10-CM

## 2010-07-27 DIAGNOSIS — I059 Rheumatic mitral valve disease, unspecified: Secondary | ICD-10-CM | POA: Insufficient documentation

## 2010-07-27 DIAGNOSIS — I079 Rheumatic tricuspid valve disease, unspecified: Secondary | ICD-10-CM | POA: Insufficient documentation

## 2010-07-27 DIAGNOSIS — Z13220 Encounter for screening for lipoid disorders: Secondary | ICD-10-CM

## 2010-07-27 DIAGNOSIS — R002 Palpitations: Secondary | ICD-10-CM

## 2010-07-27 DIAGNOSIS — E669 Obesity, unspecified: Secondary | ICD-10-CM | POA: Insufficient documentation

## 2010-07-27 LAB — LIPID PANEL
HDL: 53 mg/dL (ref >39.00)
Triglycerides: 237 mg/dL — ABNORMAL HIGH (ref 0.0–149.0)

## 2010-07-27 LAB — BRAIN NATRIURETIC PEPTIDE: Pro B Natriuretic peptide (BNP): 8 pg/mL (ref 0.0–100.0)

## 2010-07-27 LAB — TSH: TSH: 0.87 u[IU]/mL (ref 0.35–5.50)

## 2010-07-27 LAB — T4, FREE: Free T4: 0.98 ng/dL (ref 0.60–1.60)

## 2010-07-29 NOTE — Discharge Summary (Signed)
[facility]  Patient:    Miranda [name], Miranda [name] Visit Number: [id number] MRN: [id number]          Service Type: GYN Location: [id number] Attending Physician:  Susa Raring Adm. Date:  09/24/2000 Disc. Date: 09/27/2000                             Discharge Summary  HISTORY OF PRESENT ILLNESS:   This patient is a 60 year old female who is admitted to the hospital with complaints of chronic lower abdominal pelvic pain and dyspareunia. She also has symptoms of pelvic pressure at the end of the day. The patient had been previously laparoscoped for pelvic adhesions. Pertinent findings on the physical exam showed tenderness in the pelvis, no palpable masses, and prolapse of the uterus.  LABORATORY DATA:              Preoperatively her hemoglobin was 13.2 with hematocrit of 39. White count differentials were basically normal. Urinalysis was normal for a voided specimen. Her postoperative hemoglobin was 10.9 with hematocrit of 31.  HOSPITAL COURSE:              The patient was taken to the operating room where a planned LAVH/BSO was to be done. However, on inserting the laparoscope, there were severe multiple and dense abdominal and pelvic adhesions noted, so at this point the planned procedure of LAVH/BSO was converted to an abdominal procedure for safety reasons. Accordingly, laparotomy was done. Lysis of severe multiple abdominal and pelvic adhesions was accomplished so as to free up the pelvic organs. Once this was done, a total abdominal hysterectomy and bilateral salpingo-oophorectomy was performed. Blood loss was about 300 cc, none was replaced. She tolerated the procedure well.  Postoperatively, the patient had a stable course. She did complain of some nausea from her Dilaudid. This was changed to another medication. She slowly began ambulating. The second postoperative day she was slightly distended and had not passed any flatus. By July  18, however she would begin to be eating and was passing some flatus. Staples were removed from her wound. She was ambulating. She was therefore discharged to home.  DISCHARGE INSTRUCTIONS:       The patient was instructed to eat a regular diet, to have limited activity, including, but not limited to, no vaginal penetration, no heavy lifting, no driving for two weeks.  DISCHARGE FOLLOWUP:           The patient is to return to Dr. Terrence Dupont office in one months time for followup care.  DISCHARGE MEDICATIONS:        1. Darvocet-N 100 for pain.                               2. Climara 0.05 mg.  PATHOLOGY REPORT:             Written pathology report is not on the chart at this time. However, the copy that I have in my office showed that there was no malignancy. There were some fibrous adhesions on the uterine serosa and also on both ovaries.  FINAL DIAGNOSES:              1. Chronic pelvic pain and lower abdominal pain.  2. Dysmenorrhea and dyspareunia.                               3. Multiple severe abdominal and pelvic                                  adhesions.                               4. 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                               5. Uterine prolapse.  OPERATIONS:                   1. Laparoscopy.                               2. Laparotomy with lysis of multiple pelvic and                                  abdominal adhesions.                               3. Total abdominal hysterectomy.                               4. Bilateral salpingo-oophorectomy.  CONDITION ON DISCHARGE:       Improved. Attending Physician:  Susa Raring DD:  10/31/00 TD:  10/31/00 Job: [id number]

## 2010-07-29 NOTE — Op Note (Signed)
Miranda [name], Miranda [name]                     ACCOUNT NO.:  [id number]   MEDICAL RECORD NO.:  [id number]                   PATIENT TYPE:  OBV   LOCATION:  0457                                 FACILITY:  [facility]   PHYSICIAN:  Ollen Gross. Vernell Morgans, M.D.              DATE OF BIRTH:  28-Nov-1950   DATE OF PROCEDURE:  08/29/2002  DATE OF DISCHARGE:  08/30/2002                                 OPERATIVE REPORT   PREOPERATIVE DIAGNOSIS:  Gallstones.   POSTOPERATIVE DIAGNOSIS:  Gallstones.   OPERATION/PROCEDURE:  Laparoscopic cholecystectomy with intraoperative  cholangiogram.   SURGEON:  Ollen Gross. Carolynne Edouard, M.D.   ASSISTANT:  Gita Kudo, M.D.   ANESTHESIA:  General endotracheal anesthesia.   DESCRIPTION OF PROCEDURE:  After informed consent was obtained, the patient  was brought to the operating room and placed in the supine position on the  operating room table.  After adequate induction of general anesthesia, the  patient's abdomen was prepped with Betadine and draped in the usual sterile  manner.  The area below the umbilicus was infiltrated with 0.25% Marcaine.  A small incision was made with the 15-blade knife.  This incision was  carried down through the skin and subcutaneous tissue bluntly with the Kelly  clamp and [employer] retractors until the linea alba was identified.  The  linea alba was incised with the 15-blade knife and each side was grasped  with Kocher clamps and elevated anteriorly.  The preperitoneal space was  then probed bluntly with a hemostat until the peritoneum was opened and  access was gained to the abdominal cavity.  A 0 Vicryl pursestring stitch  was placed in the fascia surrounding the opening. The Hasson cannula was  placed through the opening and anchored in place with the previously placed  Vicryl pursestring stitch.  The abdomen was then insufflated with carbon  dioxide without difficulty.  The laparoscope was placed through the Hasson  cannula and the  right upper quadrant was inspected.  The dome of the  gallbladder and liver were readily identifiable.  The epigastric region was  then infiltrated with 0.25% Marcaine.  A small incision was made with the 15-  blade knife and a 10 mm port was placed bluntly through this incision into  the abdominal cavity under direct vision.  Sites were then chosen on the  right side of the abdomen with placement of a 5 mm ports.  Each of these  areas was infiltrated with 0.25% Marcaine.  Small stab incisions were made  with 15-blade knife and 5 mm ports were then placed bluntly through these  incisions into the abdominal cavity under direct vision.  A blunt grasper  was placed through the lateral-most 5 mm port and used to grasp the dome of  the gallbladder and elevate it anteriorly and superiorly.  Another blunt  grasper was placed in the other 5 mm port and used to retract on the body  and neck of the gallbladder.  The dissector was placed through the upper  midline port and using the electrocautery, the peritoneal reflection at the  gallbladder neck was opened.  Blunt dissection was then carried out in this  area until the gallbladder neck-cystic duct junction was readily identified.  Blunt dissection was carried out in this area circumferentially until a good  window was created.  A clip was placed on the gallbladder neck.  A small  ductotomy was made with the laparoscopic scissors.  A 14-gauge Angiocath was  placed percutaneously through the anterior abdominal wall under direct  vision.  A Reddick cholangiogram catheter was placed through the Angiocath  and flushed.  The Reddick catheter was then placed within the cystic duct  and anchored in place with a clip.  A cholangiogram was obtained that showed  no filling defects, a good length on the cystic duct and rapid emptying into  the duodenum.  The anchoring clip and catheters were then removed from the  patient.  Three clips were placed proximally on  the cystic duct and the duct  was divided between the two sets of clips.  Posterior to this the cystic  artery and its branches were identified.  Two clips were placed proximally  and one distally on each of the branches of the cystic artery and the artery  was divided between the two.  Next, the laparoscopic hook cautery device was  used to separate the gallbladder from the liver bed.  Prior to completely  detaching the gallbladder from the liver bed, the liver bed was inspected  and several small bleeding points were coagulated with the electrocautery.  The gallbladder was then detached the rest of the way from the liver bed  with the hook electrocautery without difficulty.  The laparoscopic was then  moved to the epigastric port.  A gallbladder grasper was placed through the  Hasson cannula and used to grasp the neck of the gallbladder.  The  gallbladder was then removed with the Hasson cannula through the  supraumbilical port without difficulty.  The fascial defect was closed with  the previously placed Vicryl pursestring stitch.  The abdomen was then  irrigated with copious amounts of saline.  The rest of the ports were  removed under direct vision and gas was allowed to escape.  The skin  incisions were all closed with interrupted 4-0 Monocryl subcuticular  stitches.  Benzoin and Steri-Strips were applied.  The patient tolerated the  procedure well.  At the end of the case all needle, sponge and instrument  counts were correct.  The patient was awakened and taken to the recovery  room in stable condition.                                                 Ollen Gross. Vernell Morgans, M.D.    PST/MEDQ  D:  09/01/2002  T:  09/02/2002  Job:  [id number]

## 2010-07-29 NOTE — H&P (Signed)
[facility]  Patient:    Miranda [name], Miranda [name] Visit Number: [id number] MRN: [id number]          Service Type: GYN Location: [id number] Attending Physician:  Susa Raring Dictated by:   Luvenia Redden, M.D. Adm. Date:  09/24/2000 Disc. Date: 09/27/2000                           History and Physical  HISTORY OF PRESENT ILLNESS:   This patient is a 60 year old female admitted to the hospital because of chronic pelvic pain, dysmenorrhea, and dyspareunia. She also has protrusion from her vagina which she has noted for some time. She has no stress incontinence.   Because she is 60 years old, she also desires removal of her ovaries and subsequent estrogen replacement.  Last Pap smear done in May 2002 was normal.  Last menstrual period prior to admission September 13, 2000.  PAST MEDICAL HISTORY:         The patient has had two pregnancies - one spontaneous abortion, and one pregnancy which went to term.  She has one living child.  She has had the usual childhood diseases.  She has had no serious medical illnesses.  She has had tubal sterilization.  FAMILY HISTORY:               She has a maternal aunt with breast cancer.  It is positive for diabetes.  There is no history of tuberculosis or bleeding disorders.  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  She is a nonsmoker, denies use of recreational drugs.  SYSTEM REVIEW:                ENT:  No complaints.  CARDIORESPIRATORY:  No complaints.  GI:  She has a history of irritable bowel syndrome for which she takes antispasmodics from time to time.  GU:  See present illness. NEUROMUSCULAR:  No complaints.  PHYSICAL EXAMINATION:  GENERAL:                      Well-nourished, well-developed 60 year old female in no acute distress.  VITAL SIGNS:                  She is 5 feet 3.5 inches tall, weighs 197 pounds.  Blood pressure 110/64, pulse 70 and  regular, respirations 12.  HEENT:                        Show pupils are equal and react to light and accommodation.  The ears, nose, and throat are normal.  NECK:                         Thyroid normal size.  CARDIAC:                      Heart rhythm regular, no murmurs heard.  LUNGS:                        Clear to auscultation and percussion.  BREASTS:                      No palpable masses.  ABDOMEN:  Soft and flat.  There are no palpable masses, no organ enlargement.  Bowel sounds are present.  SKIN:                         Smooth and dry.  EXTREMITIES:                  Show no deformities, no limitation of motion. Peripheral pulses are present.  NEUROLOGIC:                   Physiologic.  PELVIC:                       External genitalia are normal with a parous outlet.  The vagina is lined with healthy mucosa.  Bladder and rectum are pretty well supported.  Cervix presents itself at the introitus and is smooth. Uterus is retroflexed, cannot be anteverted.  It is normal size.  It is prolapsed with the cervix.  It is tender to palpation.  No adnexal masses can be felt.  RECTAL:                       Negative.  IMPRESSION:                   1. Chronic pelvic pain, dyspareunia,                                  dysmenorrhea.                               2. Uterine prolapse.                               3. History of irritable bowel syndrome.  PLAN:                         Patient is admitted for a LAVH/BSO and subsequent estrogen replacement therapy.  The patient understands that she will no longer be able to get pregnant or have periods.  Patient understands the complications and hazards of pelvic surgery, including but not limited to, bleeding, infection, injury to adjacent structures.  She agrees to undergo this procedure.Dictated by:   Luvenia Redden, M.D. Attending Physician:  Susa Raring DD:  11/01/00 TD:  11/01/00 Job:  [id number]

## 2010-07-29 NOTE — Letter (Signed)
April 12, 2008     RE:  Miranda [name], Miranda [name]  MRN:  [id number]  /  DOB:  Dec 17, 1950   To whom it may concern:   [name] is under my medical care for chronic and recurrent  asthmatic bronchitis which makes her significantly sensitive to extremes  of temperature and irritant fumes and odors.  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  Any modification of her duties that can help her avoid  these triggers of her asthmatic bronchitis will be appreciated.    Sincerely,      Clinton D. Maple Hudson, MD, Tonny Bollman, FACP  Electronically Signed    CDY/MedQ  DD: 04/12/2008  DT: 04/13/2008  Job #: [id number]

## 2010-07-29 NOTE — Op Note (Signed)
[facility]  Patient:    Miranda [name], Miranda [name]                  MRN: [id number] Adm. Date:  [id number] Attending:  Susa Raring                           Operative Report  OFFICE NUMBER:                [id number]  PREOPERATIVE DIAGNOSES:       1. Uterine prolapse.                               2. Chronic pelvic pain, dysmenorrhea, and                                  dyspareunia.                               3. Small complex cyst in the adnexa seen on                                  ultrasound.  POSTOPERATIVE DIAGNOSES:      1. Multiple severe pelvic and abdominal                                  adhesions.                               2. Adhesions of small intestine to the uterine                                  fundus.                               3. Uterine prolapse.                               4. Chronic pelvic pain, dysmenorrhea, and                                  dyspareunia.  OPERATION:                    1. Laparoscopy.                               2. Total abdominal hysterectomy and bilateral                                  salpingo-oophorectomy.                               3. Lysis of abdominal adhesions.  4. Lysis of pelvic adhesions.  SURGEON:                      Luvenia Redden, M.D.  ASSISTANT:                    Marcelle Overlie, M.D.  DESCRIPTION OF PROCEDURE:     Under good anesthesia, the patient was prepped and draped in a sterile manner.  The bladder was catheterized.  A Hulka was placed in the uterus to achieve uterine mobility.  A transverse incision was made in the lower border of the umbilicus and the Veress needle was inserted intraperitoneally and pneumoperitoneum performed.  The needle was removed. The laparoscopic trocar and cannula was introduced, and the trocar removed and the laparoscope introduced.  Immediately upon entering the pelvic cavity, there were multiple  adhesions seen.  Actually, the laparoscope entered through the omentum.  The omentum was adherent to the anterior abdominal wall throughout the lower abdomen.  There were adhesions of omentum to the sigmoid colon and the appendices epiploica of the sigmoid colon.  There were some adhesions of the ovary on the right side to the pelvic peritoneum.  There was a small loop of bowel that was adherent to the upper right portion of the uterine fundus.  Because of the extent of these adhesions, it was deemed inappropriate to attempt a laparoscopic-assisted vaginal hysterectomy, mainly because of the adhesions of the small bowel to the uterus itself, and also because of adherence of the sigmoid colon to the adnexa on the left side and the omentum.  Therefore, this procedure was aborted, pneumoperitoneum released, the instruments removed.  The skin incisions were closed with subcuticular 2-0 plain catgut.  A Foley catheter was placed in the bladder and the Hulka tenaculum was removed from the cervix.  A Pfannenstiel incision was made.  Bleeders were clamped and cut as encountered.  Once the peritoneum was entered, the omentum was meticulously dissected away from the peritoneal wall both by sharp and blunt dissection.  Bleeders were electrocoagulated as encountered.  Once the entire omentum was freed up and inspected for bleeding, a few small bleeders were electrocoagulated.  The defect at the central portion of the omentum was closed using 3-0 Monocryl sutures.  There was still some omentum adherent to the sigmoid colon.  These adhesions were lysed carefully by sharp and blunt dissection so that the sigmoid colon could be freed up.  Exploration manually of the upper abdominal organs revealed normal kidneys, liver, and no tumors in the upper abdomen.  In the pelvis, the uterus was normal size.  Both ovaries were normal.  There were adhesions of the right ovary to the pelvic side wall and these were  lysed by sharp dissection, and adhesions of the loops of small bowel that was adherent to the fundus of the uterus, and these adhesions were carefully lysed by sharp dissection.  The sigmoid colon was adherent to the peritoneum on the left pelvic side wall and in the infuandibulopelvic ligament.  These adhesions were lysed sharply nd carefully so to get the sigmoid mobile and out of the operative site.  Once this was all accomplished, intestines and omentum were packed off into the upper abdomen using wet lap packs.  The round ligaments were then suture ligated and divided.  The bladder flap was developed by sharp and blunt dissection.  The ureters were identified well out of the operative site.  The infundibulopelvic ligaments were  then clamped, cut, and doubly ligated.  The uterine vessels were skeletonized, clamped, cut, and ligated.  The bladder was further dissected down off the cervix by sharp and blunt dissection.  The cardinal ligaments were clamped, cut, and ligated.  The uterosacral ligaments were clamped, cut, and ligated.  The vagina was then entered laterally on both sides and the cervix was circumcised.  The specimen was thus removed.  The lateral vaginal apices were secured with figure-of-eight Monocryl sutures and the cuff was closed with figure-of-eight Monocryl sutures.  Individual bleeders underneath the bladder flap were electrocoagulated.  The pelvis was irrigated with warm saline and inspected for further bleeding and none was found and the saline was aspirated.  After all the counts were correct, the omentum was again inspected.  A couple of small bleeders were coagulated.  After all the counts were correct, the peritoneum was closed with continuous 2-0 Monocryl.  The rectus muscle was approximated with continuous 2-0 Monocryl.  The fascia was repaired with continuous 0 Monocryl.  The subcutaneous tissue was approximated with interrupted 2-0 plain catgut, and the  skin edges approximated with wide skin staples.  A dry sterile dressing was applied.  There was clear urine coming from the bladder at the end of the procedure.   Estimated blood loss was 300 cc and none was replaced.  The patient went to the recovery room in good condition. DD:  09/24/00 TD:  09/24/00 Job: [id number]

## 2010-07-29 NOTE — Assessment & Plan Note (Signed)
[facility]                             PULMONARY OFFICE NOTE   NAME:Miranda [name] RODRIGES                  MRN:          [id number]  DATE:02/15/2006                            DOB:          09-29-1950    HISTORY OF PRESENT ILLNESS:  Patient is a 60 year old white female  patient of Dr. Roxy Cedar who has a known history of asthma and allergic  rhinitis.  She presents related to cough and congestion.  Patient  complains of persistent nasal congestion, post nasal drip and  intermittent wheezing.  Patient has recently finished a Z pack and  reports she has only had minimum improvement in symptoms.   PAST MEDICAL HISTORY:  Reviewed.   CURRENT MEDICATIONS:  Reviewed.   PHYSICAL EXAMINATION:  GENERAL APPEARANCE:  Patient is a pleasant female  in no acute distress.  VITAL SIGNS:  She is afebrile with stable vital signs.  HEENT:  Nasal mucosa is pale.  Conjunctivae not injected.  Posterior  pharynx is clear without any exudate.  NECK:  Supple without adenopathy.  LUNGS:  Coarse breath sounds without any wheezing.  CARDIOVASCULAR:  Regular rate.  ABDOMEN:  Soft and benign.  EXTREMITIES:  Warm without any edema.   IMPRESSION AND PLAN:  Acute upper respiratory infection with mild  asthmatic flare.  Patient is to begin Claritin 10 mg daily.  Continue on  Pepcid daily.  She has been tried on multiple proton pump inhibitors and  has been intolerant to these due to headache.  She will add in Mucinex  DM twice daily.  Begin a prednisone taper.  May use Tussionex 4 ounces 1  teaspoon every 12 hours as needed for cough.  Patient is to return with  Dr. Maple [name] in two weeks or sooner if needed.      Rubye Oaks, NP  Electronically Signed      Miranda D. Maple Hudson, MD, Tonny Bollman, FACP  Electronically Signed   TP/MedQ  DD: 02/19/2006  DT: 02/20/2006  Job #: [id number]

## 2010-07-29 NOTE — Assessment & Plan Note (Signed)
[facility]                               PULMONARY OFFICE NOTE   NAME:Homesley, SHEMEIKA STARZYK                  MRN:          [id number]  DATE:01/16/2006                            DOB:          1950/05/27    PROBLEMS:  1. Allergic rhinitis.  2. Asthma.  3. Urticaria.  4. Cough.   HISTORY:  She discussed her chronic cough.  It was stopped with a cortisone  injection from emergent care, and she was good for 2 weeks until it  gradually returned.  [facility] gave prednisone pills, which  stopped it again for a couple of weeks after therapy ended.  She says her  worst season is usually fall, and this year she feels like she is starting a  little earlier than usual.  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  She does not recognize active reflux symptoms, but we  discussed the frequent association of reflux with cough.  Prevacid and  Nexium had both caused migraine.  Her nebulized albuterol helps some but  makes her shaky.  She claims allergy to ZANTAC.  A brother has had a  pulmonary embolism.  She has had no symptoms of phlebitis or pulmonary  embolism in the past.   MEDICATIONS:  Estrogen patch, over-the-counter medicines for nasal  congestion, Singulair, Tussionex or Robitussin, Sinofresh nasal spray,  Astelin, home nebulizer with albuterol, PV Tussin cough syrup.   ALLERGIES:  Drug intolerance of FLAGYL, SULFA, LEVAQUIN, AUGMENTIN and  ZANTAC, which she says causes allergy shock.  Amoxicillin has been well  tolerated.   OBJECTIVE:  Weight 200 pounds, BP 120/72, pulse regular 81, room air  saturation 99%.  Pharynx is somewhat reddened.  She has an intermittent throat clearing-type cough.  Quiet, clear chest, no stridor or labored breathing.  HEART:  Sounds are regular without murmur or gallop.   IMPRESSION:  Primary complaint today is cough, which fits a nonspecific  multifactorial or possibly esophageal  reflux pattern.   PLAN:  1. Try Tagamet q.i.d. over-the-counter.  2. Asmanex sample and prescription with instruction, 1 puff b.i.d.  3. Prednisone eight-day taper from 20 mg with steroid talk, to hold.  4. Refilled PV Tussin 200 mL, 5 mL q. six h. p.r.n. cough.  5. Steroid talk done.  6. Schedule return in 6 weeks, earlier p.r.n.     Clinton D. Maple Hudson, MD, Tonny Bollman, FACP  Electronically Signed    CDY/MedQ  DD: 01/17/2006  DT: 01/18/2006  Job #: [id number]   cc:   Chales Salmon. Abigail Miyamoto, M.D.

## 2010-08-02 ENCOUNTER — Telehealth: Payer: Self-pay | Admitting: Cardiology

## 2010-08-02 ENCOUNTER — Other Ambulatory Visit: Payer: Self-pay | Admitting: *Deleted

## 2010-08-02 DIAGNOSIS — I10 Essential (primary) hypertension: Secondary | ICD-10-CM

## 2010-08-02 DIAGNOSIS — R002 Palpitations: Secondary | ICD-10-CM

## 2010-08-02 MED ORDER — PRAVASTATIN SODIUM 40 MG PO TABS: 40.0000 mg | ORAL_TABLET | Freq: Every evening | ORAL | Status: DC

## 2010-08-02 NOTE — Telephone Encounter (Signed)
See lab results date 07/27/10. I talked with pt by telephone.

## 2010-08-02 NOTE — Telephone Encounter (Signed)
Pt returning your call

## 2010-08-15 ENCOUNTER — Telehealth: Payer: Self-pay | Admitting: Cardiology

## 2010-08-15 NOTE — Telephone Encounter (Signed)
Pt is taking meds for b/p and it is messing her stomach up and she wants to come off of it

## 2010-08-15 NOTE — Telephone Encounter (Signed)
I talked with pt today. Pt states she started Atenolol 25mg  about 10 days ago. Pt states about 1 hour after she takes the Atenolol she gets stomach pain. She states she had not had this pain before she started the Atenolol. Pt states she does take chlolestyramine for diarrhea but thinks her stomach pain is related to Atenolol. Pt will decrease Atenolol to 12.5mg  daily to see if that helps her stomach pain. She will take and record her BP. She has an appt with Dr Shirlee Latch 08/23/10. She will bring BP readings and discuss medication at the appt with Dr Shirlee Latch.

## 2010-08-16 ENCOUNTER — Encounter: Payer: Self-pay | Admitting: Gastroenterology

## 2010-08-22 ENCOUNTER — Encounter: Payer: Self-pay | Admitting: Cardiology

## 2010-08-23 ENCOUNTER — Telehealth: Payer: Self-pay | Admitting: Gastroenterology

## 2010-08-23 ENCOUNTER — Encounter: Payer: Self-pay | Admitting: Cardiology

## 2010-08-23 ENCOUNTER — Ambulatory Visit (INDEPENDENT_AMBULATORY_CARE_PROVIDER_SITE_OTHER): Payer: BC Managed Care – PPO | Admitting: Cardiology

## 2010-08-23 ENCOUNTER — Telehealth: Payer: Self-pay | Admitting: *Deleted

## 2010-08-23 DIAGNOSIS — R42 Dizziness and giddiness: Secondary | ICD-10-CM

## 2010-08-23 DIAGNOSIS — R109 Unspecified abdominal pain: Secondary | ICD-10-CM

## 2010-08-23 LAB — HEPATIC FUNCTION PANEL
ALT: 30 U/L (ref 0–35)
AST: 23 U/L (ref 0–37)
Alkaline Phosphatase: 75 U/L (ref 39–117)
Bilirubin, Direct: 0.1 mg/dL (ref 0.0–0.3)
Total Bilirubin: 0.7 mg/dL (ref 0.3–1.2)
Total Protein: 7.4 g/dL (ref 6.0–8.3)

## 2010-08-23 NOTE — Telephone Encounter (Signed)
Monitor results 07/27/10-08/16/10 reviewed by Dr Shirlee Latch at time of office visit today. Original report to returned to [facility].

## 2010-08-23 NOTE — Patient Instructions (Addendum)
Lab today--Lipase/Amylase/ Liver profile    Dr Shirlee Latch has referred you to GI for evaluation of your abdominal pain.  Start Pravastatin 20mg  daily in the evening. You will need to have fasting lab done 2 months after you start Pravastatin. Call our office and let us know when you start the Pravastatin so we can schedule the lab.  You have the prescription for this.  You do not need to schedule a follow-up appt with Dr Shirlee Latch.

## 2010-08-24 ENCOUNTER — Encounter: Payer: Self-pay | Admitting: Physician Assistant

## 2010-08-24 ENCOUNTER — Encounter: Payer: Self-pay | Admitting: Internal Medicine

## 2010-08-24 ENCOUNTER — Other Ambulatory Visit (INDEPENDENT_AMBULATORY_CARE_PROVIDER_SITE_OTHER): Payer: BC Managed Care – PPO

## 2010-08-24 ENCOUNTER — Ambulatory Visit (INDEPENDENT_AMBULATORY_CARE_PROVIDER_SITE_OTHER): Payer: BC Managed Care – PPO | Admitting: Physician Assistant

## 2010-08-24 DIAGNOSIS — R1013 Epigastric pain: Secondary | ICD-10-CM

## 2010-08-24 DIAGNOSIS — R141 Gas pain: Secondary | ICD-10-CM

## 2010-08-24 DIAGNOSIS — K219 Gastro-esophageal reflux disease without esophagitis: Secondary | ICD-10-CM

## 2010-08-24 DIAGNOSIS — R143 Flatulence: Secondary | ICD-10-CM

## 2010-08-24 DIAGNOSIS — R109 Unspecified abdominal pain: Secondary | ICD-10-CM | POA: Insufficient documentation

## 2010-08-24 LAB — CBC WITH DIFFERENTIAL/PLATELET
Basophils Absolute: 0.1 10*3/uL (ref 0.0–0.1)
HCT: 40.6 % (ref 36.0–46.0)
Hemoglobin: 13.8 g/dL (ref 12.0–15.0)
Lymphs Abs: 2.4 10*3/uL (ref 0.7–4.0)
MCHC: 34 g/dL (ref 30.0–36.0)
MCV: 91.6 fl (ref 78.0–100.0)
Monocytes Absolute: 0.5 10*3/uL (ref 0.1–1.0)
Neutro Abs: 3.9 10*3/uL (ref 1.4–7.7)
Platelets: 334 10*3/uL (ref 150.0–400.0)
RDW: 13.9 % (ref 11.5–14.6)

## 2010-08-24 MED ORDER — RABEPRAZOLE SODIUM 20 MG PO TBEC: 20.0000 mg | DELAYED_RELEASE_TABLET | Freq: Every day | ORAL | Status: DC

## 2010-08-24 MED ORDER — PEG-KCL-NACL-NASULF-NA ASC-C 100 G PO SOLR: ORAL | Status: DC

## 2010-08-24 NOTE — Patient Instructions (Signed)
Please go to the basement level to have your labs drawn.  Take Aciphex 20 mg tab 30 min before breakfast. We have scheduled the Endoscopy/Colonoscopy with Dr Marina Goodell. Directions and brochure provided.

## 2010-08-24 NOTE — Assessment & Plan Note (Signed)
Main complaint today is several weeks of pain across her upper abdomen with some tenderness but no peritoneal signs. She does not drink ETOH, and she has had a cholecystectomy.  It is possible that this could be a peptic ulcer.   I suggested a PPI trial but she is allergic to PPIs.  She also has not been able to take Zantac.  I am going to refer her to GI for evaluation and possible EGD.

## 2010-08-24 NOTE — Progress Notes (Signed)
PCP: Dr. Luciana Axe  60 yo with history of migraines presented initially for cardiology evaluation of "dizzy spells."  Patient states that she is mildly dizzy "all the time."  However, superimposed on this low-level background of dizziness, she has had about 6 episodes in the last year of very severe "spinning" associated with nausea.  This sounds like vertigo.  These vertigo episodes will last for about 15-30 minutes.  She does not pass out during the episodes, and denies prior syncope.  Sometimes she will feel her heart pound but not always. She has seen a headache specialist who thinks that her spells are likely due to vertigo, possibly in association with migraines.  She was tried on Topamax for migraine prophylaxis but could not tolerate it.  I had her try atenolol for migraine prophylaxis instead of Topamax (given her high BP at home), but she stopped it due to the development of abdominal pain.   I worked the patient up for a cardiac cause of her "dizziness."  She had a 3 week event monitor showing mainly sinus rhythm with two 5-6 beat runs of possible atrial tachycardia that would be unlikely to cause any significant symptoms.  TSH and free T4 were normal.  Echo showed normal LV systolic function and no significant valvular dysfunction.  I checked her lipids and found that her LDL is quite high.    Since last appointment, patient has developed significant abdominal pain.  This is crampy and located across her entire upper abdomen in a band. She has had her gallbladder out.  Pain is worse with meals but is present to some extent most of the time.  It has been present for several weeks now.  She does have a history of chronic abdominal pain but this is worse than in the past.   Labs (5/12): BNP normal, TSH/free T4 normal, HDL 53, TGs 237, LDL 162  PMH: 1. Asthma 2. Panic attacks 3. Migraines  4. Tinnitus 5. Vertigo 6. TAH/BSO 7. GERD 8. Low back pain, history of lumbar fusion 9. "dizziness:"   Cardiac workup with 3 week event monitor (5/12) showing only two 5-6 beat runs of probable atrial tachycardia that did not appear to be likely to cause symptoms.  Echo (5/12): showed EF 60-65%, grade I diastolic dysfunction, no significant valvular dysfunction.   SH: Lives in [place].  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 Married, 1 child.   FH: Brother with PE.  No premature CAD.   ROS: All systems reviewed and negative except as per HPI.   Current Outpatient Prescriptions  Medication Sig Dispense Refill  . ipratropium (ATROVENT HFA) 17 MCG/ACT inhaler 2 puffs four times a day as needed rescue inhaler       . levalbuterol (XOPENEX HFA) 45 MCG/ACT inhaler Inhale 2 puffs into the lungs 4 (four) times daily as needed.        . levalbuterol (XOPENEX) 0.63 MG/3ML nebulizer solution Take 1 ampule by nebulization 3 (three) times daily as needed.        . loratadine (ALAVERT) 10 MG tablet Take 10 mg by mouth daily as needed.        Marland Kitchen atenolol (TENORMIN) 25 MG tablet Take 1 tablet (25 mg total) by mouth daily.  30 tablet  6  . cholestyramine (QUESTRAN) 4 G packet 1 scoop per day.       . diphenhydrAMINE (BENADRYL) 25 mg capsule Take 25 mg by mouth as needed.       Marland Kitchen  HYDROcodone-homatropine (HYCODAN) 5-1.5 MG/5ML syrup 1 teaspoon four times a day as needed for cough       . peg 3350 powder (MOVIPREP) 100 G SOLR Take as directed Moviprep  1 kit  0  . pravastatin (PRAVACHOL) 20 MG tablet Take 1 tablet (20 mg total) by mouth every evening.      . RABEprazole (ACIPHEX) 20 MG tablet Take 1 tablet (20 mg total) by mouth daily.  12 tablet  0    BP 112/77  Pulse 81  Resp 14  Ht 5\' 3"  (1.6 m)  Wt 200 lb (90.719 kg)  BMI 35.43 kg/m2 General: NAD Neck: No JVD, no thyromegaly or thyroid nodule.  Lungs: Clear to auscultation bilaterally with normal respiratory effort. CV: Nondisplaced PMI.  Heart regular S1/S2, no S3/S4, no murmur.  No peripheral edema.  No  carotid bruit.  Normal pedal pulses.  Abdomen: Soft, some tenderness to palpation across upper abdomen but no peritoneal signs. Neurologic: Alert and oriented x 3.  Psych: Normal affect. Extremities: No clubbing or cyanosis.

## 2010-08-24 NOTE — Telephone Encounter (Signed)
Spoke with Miranda [name] with Dr Marca Ancona and offered her an appt with Mike Gip, PA; she will see [name] today at 1:30pm.

## 2010-08-24 NOTE — Assessment & Plan Note (Addendum)
Patient's symptoms seem most like vertigo, possibly in association with migraine.  Event monitor showed no significant arrhythmia and echo showed structurally normal heart, so I do not think her dizziness is cardiac in origin.  Thyroid indices were normal.   Patient is set up to see an ENT in [facility].

## 2010-08-25 NOTE — Progress Notes (Signed)
Subjective:    Patient ID: Miranda [name], female    DOB: 1951/01/26, 60 y.o.   MRN: [id number]  HPI Miranda [name] is a pleasant 60 year old white female due to GI today referred by her cardiologist. She had been seen by Dr. Shirlee Latch for evaluation of dizzy spells recently. It is felt that she likely has vertigo. She also has history of migraines but has been evaluated by neurology and apparently the dizziness is not fell related to migraines. Her cardiac evaluation was negative. She was found to be hypertensive and had been started on atenolol.  Patient relates that she has had long-term problems for many years with diarrhea even prior to having her gallbladder removed. She states that the diarrhea did get worse post cholecystectomy. She does take Questran and varying doses which does seem to help her symptoms. Today she is concerned about recent increase in acid reflux symptoms which have been associated with a "fluttering" sensation in her chest. She is concerned that this is spasm of her esophagus. She's also had been experiencing early morning discomfort across her upper abdomen. She has multiple medication intolerances and had tried Zantac in the past in association with another illness and actually went into shock. She's been on Nexium which she could not tolerate due to dizziness and headache, also had dizziness with Prevacid. Prilosec in the past made her feel more nauseated. She complains of a sensation of having a "acid pool" in her chest with burning throughout her chest and into her abdomen. She feels comfortable within 30 minutes of eating and does get some nausea. She denies any dysphagia or odynophagia but does feel that her stomach is slow to empty.  Recent labs with hepatic panel and amylase and lipase were negative. She did have MRI of her brain done in April of 2012 which was negative with the exception of some mild small vessel disease. Patient relates that she did have a remote colonoscopy  approximately 20 years ago with Dr. Watt Climes    Review of Systems  Constitutional: Negative.   HENT: Negative.   Eyes: Negative.   Respiratory: Negative.   Cardiovascular: Positive for palpitations.  Gastrointestinal: Positive for nausea, abdominal pain and diarrhea.  Genitourinary: Negative.   Musculoskeletal: Negative.   Skin: Negative.   Neurological: Negative.   Hematological: Negative.   Psychiatric/Behavioral: Negative.    Outpatient Prescriptions Prior to Visit  Medication Sig Dispense Refill  . diphenhydrAMINE (BENADRYL) 25 mg capsule Take 25 mg by mouth as needed.       Marland Kitchen HYDROcodone-homatropine (HYCODAN) 5-1.5 MG/5ML syrup 1 teaspoon four times a day as needed for cough       . ipratropium (ATROVENT HFA) 17 MCG/ACT inhaler 2 puffs four times a day as needed rescue inhaler       . levalbuterol (XOPENEX HFA) 45 MCG/ACT inhaler Inhale 2 puffs into the lungs 4 (four) times daily as needed.        . levalbuterol (XOPENEX) 0.63 MG/3ML nebulizer solution Take 1 ampule by nebulization 3 (three) times daily as needed.        . loratadine (ALAVERT) 10 MG tablet Take 10 mg by mouth daily as needed.        Marland Kitchen atenolol (TENORMIN) 25 MG tablet Take 1 tablet (25 mg total) by mouth daily.  30 tablet  6  . cholestyramine (QUESTRAN) 4 G packet 1 scoop per day.       . pravastatin (PRAVACHOL) 20 MG tablet Take 1 tablet (20 mg total)  by mouth every evening.           Objective:   Physical Exam Well-developed white female in no acute distress, alert and oriented x3, pleasant  HEENT nontraumatic normocephalic EOMI PERRLA sclera anicteric  Neck supple; no JVD  Cardiovascular; regular rate and rhythm with S1-S2 no murmur rub or gallop  Pulmonary; clear bilaterally  Abdomen;; soft basically nontender no palpable mass or hepatosplenomegaly bowel sounds active  Rectal; not done  Psych; mood and affect normal and appropriate        Assessment & Plan:  #15 60 year old female with chronic  GERD with progressive symptoms and intolerance to multiple medications .  Plan Schedule for upper endoscopy with Dr. Marina Goodell, procedure discussed in detail with the patient Start trial of AcipHex 20 mg by mouth daily, she was given samples today and if she can tolerate this will call her in a prescription.  #2 Chronic diarrhea, suspect this may be IBS however cannot rule out underlying celiac disease microscopic colitis etc.  Plan; Schedule for colonoscopy with Dr. Marina Goodell, will need random biopsies Check CBC celiac panel, stool for O&P in stool lactoferrin. Further plans pending results of above  #3 Dizziness, probable vertigo. Patient is undergoing further evaluation and has upcoming appointment in [facility].

## 2010-08-26 ENCOUNTER — Telehealth: Payer: Self-pay | Admitting: *Deleted

## 2010-08-26 NOTE — Telephone Encounter (Signed)
Message copied by Daphine Deutscher on Fri Aug 26, 2010  1:22 PM ------      Message from: [place], [place] S      Created: Fri Aug 26, 2010 12:51 PM       PLEASE LET PT KNOW HER CBC IS NORMAL

## 2010-08-26 NOTE — Telephone Encounter (Signed)
Left message for patient to call me

## 2010-08-28 NOTE — Progress Notes (Signed)
Agree with initial assessment and plans 

## 2010-08-29 ENCOUNTER — Telehealth: Payer: Self-pay | Admitting: *Deleted

## 2010-08-29 NOTE — Telephone Encounter (Signed)
Message copied by Daphine Deutscher on Mon Aug 29, 2010  8:33 AM ------      Message from: [place], [place] S      Created: Fri Aug 26, 2010 12:51 PM       PLEASE LET PT KNOW HER CBC IS NORMAL

## 2010-08-29 NOTE — Telephone Encounter (Signed)
Patient given results

## 2010-08-29 NOTE — Telephone Encounter (Signed)
Patient given results as per [name] Esterwood, PA. 

## 2010-08-30 ENCOUNTER — Encounter: Payer: BC Managed Care – PPO | Admitting: Internal Medicine

## 2010-09-26 ENCOUNTER — Ambulatory Visit: Payer: BC Managed Care – PPO | Admitting: Gastroenterology

## 2010-09-28 ENCOUNTER — Other Ambulatory Visit: Payer: BC Managed Care – PPO | Admitting: *Deleted

## 2010-10-11 ENCOUNTER — Ambulatory Visit (AMBULATORY_SURGERY_CENTER): Payer: BC Managed Care – PPO | Admitting: Internal Medicine

## 2010-10-11 ENCOUNTER — Encounter: Payer: Self-pay | Admitting: Internal Medicine

## 2010-10-11 VITALS — BP 146/87 | HR 82 | Temp 98.1°F | Resp 17 | Ht 63.0 in | Wt 190.0 lb

## 2010-10-11 DIAGNOSIS — K5730 Diverticulosis of large intestine without perforation or abscess without bleeding: Secondary | ICD-10-CM

## 2010-10-11 DIAGNOSIS — K219 Gastro-esophageal reflux disease without esophagitis: Secondary | ICD-10-CM

## 2010-10-11 DIAGNOSIS — R109 Unspecified abdominal pain: Secondary | ICD-10-CM

## 2010-10-11 DIAGNOSIS — R197 Diarrhea, unspecified: Secondary | ICD-10-CM

## 2010-10-11 DIAGNOSIS — D126 Benign neoplasm of colon, unspecified: Secondary | ICD-10-CM

## 2010-10-11 DIAGNOSIS — Z1211 Encounter for screening for malignant neoplasm of colon: Secondary | ICD-10-CM

## 2010-10-11 MED ORDER — SODIUM CHLORIDE 0.9 % IV SOLN: 500.0000 mL | INTRAVENOUS | Status: DC

## 2010-10-11 NOTE — Patient Instructions (Signed)
Refer to the picture pages for the findings from the upper endo and colonoscopy exams.  Please follow the green and blue discharge instruction sheets the rest of the day.  Resume your prior medications today.  Call if any questions or concerns.

## 2010-10-11 NOTE — Progress Notes (Signed)
No complaints on discharge.  MAW 

## 2010-10-12 ENCOUNTER — Telehealth: Payer: Self-pay | Admitting: *Deleted

## 2010-10-12 NOTE — Telephone Encounter (Signed)
No answer, line busy.

## 2010-10-31 ENCOUNTER — Telehealth: Payer: Self-pay | Admitting: Internal Medicine

## 2010-10-31 NOTE — Telephone Encounter (Signed)
Pt states that she had an endo/colon with Dr. Marina Goodell recently, she reports she is still having lots of problems with abdominal pain. She states that she cannot eat, what she does eat either runs right through her or she vomits. She states she cannot take any medicine because it "tears her stomach up." States she hasn't eaten in 5 days and she thinks she is dehydrated. Pt states that she was seen at [facility] and was told there was something wrong with her vestibular nerve, pt doesn't know if this is causing problems with her stomach or not. She also reports that her "anus is jumping", similar to a twitch that you have in your eye. Pt very tearful and states that something has to be done, she states she needs some help. Dr. Marina Goodell please advise.

## 2010-11-01 NOTE — Telephone Encounter (Signed)
Pt aware.

## 2010-11-01 NOTE — Telephone Encounter (Signed)
Her GI work up was extensive and normal. No new recommendations or plans from a GI standpoint. Seems to have a number of different complaints. Recommend that she follow up with her PCP

## 2010-11-15 ENCOUNTER — Emergency Department (HOSPITAL_COMMUNITY)
Admission: EM | Admit: 2010-11-15 | Discharge: 2010-11-16 | Disposition: A | Discharge status: Home / Self Care | Payer: BC Managed Care – PPO | Attending: Emergency Medicine | Admitting: Emergency Medicine

## 2010-11-15 ENCOUNTER — Emergency Department (HOSPITAL_COMMUNITY)
Admission: EM | Admit: 2010-11-15 | Discharge: 2010-11-15 | Disposition: A | Discharge status: Home / Self Care | Payer: BC Managed Care – PPO | Source: Home / Self Care | Attending: Emergency Medicine | Admitting: Emergency Medicine

## 2010-11-15 DIAGNOSIS — F411 Generalized anxiety disorder: Secondary | ICD-10-CM | POA: Insufficient documentation

## 2010-11-15 DIAGNOSIS — F329 Major depressive disorder, single episode, unspecified: Secondary | ICD-10-CM | POA: Insufficient documentation

## 2010-11-15 DIAGNOSIS — F3289 Other specified depressive episodes: Secondary | ICD-10-CM | POA: Insufficient documentation

## 2010-11-15 LAB — COMPREHENSIVE METABOLIC PANEL
ALT: 29 U/L (ref 0–35)
AST: 29 U/L (ref 0–37)
CO2: 27 mEq/L (ref 19–32)
Chloride: 100 mEq/L (ref 96–112)
GFR calc non Af Amer: >60 mL/min (ref >60)
Sodium: 139 mEq/L (ref 135–145)
Total Bilirubin: 0.5 mg/dL (ref 0.3–1.2)

## 2010-11-15 LAB — DIFFERENTIAL
Basophils Absolute: 0 10*3/uL (ref 0.0–0.1)
Basophils Relative: 1 % (ref 0–1)
Eosinophils Absolute: 0 10*3/uL (ref 0.0–0.7)
Neutrophils Relative %: 62 % (ref 43–77)

## 2010-11-15 LAB — RAPID URINE DRUG SCREEN, HOSP PERFORMED
Amphetamines: NOT DETECTED
Barbiturates: NOT DETECTED
Cocaine: NOT DETECTED
Tetrahydrocannabinol: NOT DETECTED

## 2010-11-15 LAB — CBC
Platelets: 336 10*3/uL (ref 150–400)
RBC: 4.97 MIL/uL (ref 3.87–5.11)
WBC: 6.6 10*3/uL (ref 4.0–10.5)

## 2010-11-20 ENCOUNTER — Emergency Department (HOSPITAL_COMMUNITY)
Admission: EM | Admit: 2010-11-20 | Discharge: 2010-11-21 | Disposition: A | Discharge status: Home / Self Care | Payer: BC Managed Care – PPO | Attending: Emergency Medicine | Admitting: Emergency Medicine

## 2010-11-20 DIAGNOSIS — R1013 Epigastric pain: Secondary | ICD-10-CM | POA: Insufficient documentation

## 2010-11-20 DIAGNOSIS — K219 Gastro-esophageal reflux disease without esophagitis: Secondary | ICD-10-CM | POA: Insufficient documentation

## 2010-11-20 DIAGNOSIS — M546 Pain in thoracic spine: Secondary | ICD-10-CM | POA: Insufficient documentation

## 2010-11-20 DIAGNOSIS — M5380 Other specified dorsopathies, site unspecified: Secondary | ICD-10-CM | POA: Insufficient documentation

## 2010-11-20 DIAGNOSIS — M545 Low back pain, unspecified: Secondary | ICD-10-CM | POA: Insufficient documentation

## 2010-11-20 DIAGNOSIS — G8929 Other chronic pain: Secondary | ICD-10-CM | POA: Insufficient documentation

## 2010-11-20 DIAGNOSIS — Z79899 Other long term (current) drug therapy: Secondary | ICD-10-CM | POA: Insufficient documentation

## 2010-11-20 DIAGNOSIS — F341 Dysthymic disorder: Secondary | ICD-10-CM | POA: Insufficient documentation

## 2010-11-23 ENCOUNTER — Emergency Department (HOSPITAL_BASED_OUTPATIENT_CLINIC_OR_DEPARTMENT_OTHER)
Admission: EM | Admit: 2010-11-23 | Discharge: 2010-11-24 | Disposition: A | Discharge status: Other Acute Inpatient Hospital | Payer: BC Managed Care – PPO | Attending: Emergency Medicine | Admitting: Emergency Medicine

## 2010-11-23 ENCOUNTER — Encounter (HOSPITAL_BASED_OUTPATIENT_CLINIC_OR_DEPARTMENT_OTHER): Payer: Self-pay

## 2010-11-23 ENCOUNTER — Telehealth: Payer: Self-pay | Admitting: Internal Medicine

## 2010-11-23 DIAGNOSIS — F341 Dysthymic disorder: Secondary | ICD-10-CM | POA: Insufficient documentation

## 2010-11-23 DIAGNOSIS — F32A Depression, unspecified: Secondary | ICD-10-CM

## 2010-11-23 DIAGNOSIS — R112 Nausea with vomiting, unspecified: Secondary | ICD-10-CM

## 2010-11-23 DIAGNOSIS — K219 Gastro-esophageal reflux disease without esophagitis: Secondary | ICD-10-CM | POA: Insufficient documentation

## 2010-11-23 DIAGNOSIS — I10 Essential (primary) hypertension: Secondary | ICD-10-CM | POA: Insufficient documentation

## 2010-11-23 DIAGNOSIS — J45909 Unspecified asthma, uncomplicated: Secondary | ICD-10-CM | POA: Insufficient documentation

## 2010-11-23 DIAGNOSIS — F329 Major depressive disorder, single episode, unspecified: Secondary | ICD-10-CM

## 2010-11-23 DIAGNOSIS — F411 Generalized anxiety disorder: Secondary | ICD-10-CM

## 2010-11-23 HISTORY — DX: Anxiety disorder, unspecified: F41.9

## 2010-11-23 HISTORY — DX: Major depressive disorder, single episode, unspecified: F32.9

## 2010-11-23 HISTORY — DX: Depression, unspecified: F32.A

## 2010-11-23 HISTORY — DX: Dizziness and giddiness: R42

## 2010-11-23 LAB — CBC
HCT: 46.2 % — ABNORMAL HIGH (ref 36.0–46.0)
Hemoglobin: 15.6 g/dL — ABNORMAL HIGH (ref 12.0–15.0)
MCH: 29.8 pg (ref 26.0–34.0)
MCV: 88.3 fL (ref 78.0–100.0)
RBC: 5.23 MIL/uL — ABNORMAL HIGH (ref 3.87–5.11)

## 2010-11-23 LAB — DIFFERENTIAL
Eosinophils Absolute: 0.1 10*3/uL (ref 0.0–0.7)
Eosinophils Relative: 1 % (ref 0–5)
Lymphs Abs: 1.8 10*3/uL (ref 0.7–4.0)
Monocytes Relative: 6 % (ref 3–12)
Neutrophils Relative %: 70 % (ref 43–77)

## 2010-11-23 LAB — COMPREHENSIVE METABOLIC PANEL
Alkaline Phosphatase: 91 U/L (ref 39–117)
BUN: 14 mg/dL (ref 6–23)
GFR calc Af Amer: >60 mL/min (ref >60)
Glucose, Bld: 108 mg/dL — ABNORMAL HIGH (ref 70–99)
Potassium: 3.8 mEq/L (ref 3.5–5.1)
Total Protein: 7.8 g/dL (ref 6.0–8.3)

## 2010-11-23 LAB — URINALYSIS, ROUTINE W REFLEX MICROSCOPIC
Bilirubin Urine: NEGATIVE
Hgb urine dipstick: NEGATIVE
Ketones, ur: 40 mg/dL — AB
Protein, ur: NEGATIVE mg/dL
Urobilinogen, UA: 0.2 mg/dL (ref 0.0–1.0)

## 2010-11-23 LAB — RAPID URINE DRUG SCREEN, HOSP PERFORMED
Barbiturates: NOT DETECTED
Opiates: NOT DETECTED
Tetrahydrocannabinol: NOT DETECTED

## 2010-11-23 LAB — LIPASE, BLOOD: Lipase: 31 U/L (ref 11–59)

## 2010-11-23 MED ORDER — ONDANSETRON HCL 4 MG/2ML IJ SOLN
4.0000 mg | Freq: Once | INTRAMUSCULAR | Status: AC
  Administered 2010-11-23: 4 mg via INTRAVENOUS
  Filled 2010-11-23: qty 2

## 2010-11-23 MED ORDER — LORAZEPAM 2 MG/ML IJ SOLN: 1.0000 mg | Freq: Once | INTRAMUSCULAR | Status: DC

## 2010-11-23 MED ORDER — ACETAMINOPHEN 325 MG PO TABS
650.0000 mg | ORAL_TABLET | Freq: Once | ORAL | Status: AC
  Administered 2010-11-23: 650 mg via ORAL
  Filled 2010-11-23: qty 2

## 2010-11-23 MED ORDER — SODIUM CHLORIDE 0.9 % IV BOLUS (SEPSIS)
1000.0000 mL | Freq: Once | INTRAVENOUS | Status: AC
  Administered 2010-11-23: 1000 mL via INTRAVENOUS

## 2010-11-23 NOTE — Telephone Encounter (Signed)
Called pt, no answer.   Spoke with pts husband. He is very concerned for his wife. He states he is afraid she will be dead in 4-5 days if things continue the way they are. He reports that she is in a lot of pain, she was placed on new psych meds yesterday but cannot keep them down. States she cannot keep the psych meds down due to nausea and pain. He states that he was told by the er if she had suicidal ideas that they could commit her and help her. They let her go home. He states that she does make comments about taking an overdose to end the pain and feel better. Asked pts husband if he had told the doctors this and he states he has not. Told him the best thing for him to do was to take her to the ER and tell them exactly what he was telling me. Pt [facility] not had good experiences at [facility] or [facility] States he may take her to [facility]. Encouraged him to take her to the ER, that it sounded like she needed to be evaluated and needed help. He verbalized understanding.

## 2010-11-23 NOTE — Telephone Encounter (Signed)
These issues need to be addressed by ER and her primary MD, Not GI

## 2010-11-23 NOTE — ED Notes (Signed)
In with pt-daughter states pt "seems to be shaking less"-pt in NAD-advised no new orders from EDP-spoke with pt, husband and daughter at length about plan of care

## 2010-11-23 NOTE — ED Notes (Signed)
Pt c/o burning all over and shaking all over-pt thinks is allergic reaction to zofran-pt hs no rash or resp distress-these are same s/s pt states she has with most meds-pt up to [facility] with husband assist-pt walked to [facility] to obtain UA on her own when husband was not in room

## 2010-11-23 NOTE — ED Notes (Signed)
Pt given oatmeal/water per request

## 2010-11-23 NOTE — ED Notes (Signed)
Pt refused ativan states she can not tolerate ativan or valium

## 2010-11-23 NOTE — ED Notes (Signed)
Spoke with Miranda [name] at [facility] dispatch-advised no return calls from initial assessment and placement attempts-[name] will be in at 10pm to continue placement attempts

## 2010-11-23 NOTE — ED Provider Notes (Signed)
History     CSN: [id number] Arrival date & time: 11/23/2010 11:37 AM  Chief Complaint  Patient presents with  . Emesis  . Anxiety   HPI Comments: This is the third ED visit this week for this patient with multiple chronic complaints. She states that she's had problems with her stomach for the past 5 months and vomiting frequently unable to keep anything down including her pills. She's had extensive workup by GI doctor Marina Goodell. Had negative endoscopy from above and below.  Her husband called Dr. Marina Goodell to say is worried that his wife can't go on living like this.  Patient seen by psychiatry yesterday and started on Lexapro she is unable to keep down.  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 She denies any abdominal pain, fevers, chest pain, shortness of breath. She does endorse suicidality and states that she wants to take an overdose of pills.  The history is provided by the patient and the spouse.    Past Medical History  Diagnosis Date  . Cough   . Urticaria   . Asthma   . Allergic rhinitis   . GERD (gastroesophageal reflux disease)   . Hyperlipemia   . Small vessel disease   . Hypertension   . Diarrhea   . Migraine   . Vertigo   . Anxiety   . Depression     Past Surgical History  Procedure Date  . Rhinoplasty   . Septoplasty   . Lumbar spine surgery     x4   . Cholecystectomy   . Total abdominal hysterectomy     Family History  Problem Relation Age of Onset  . Pulmonary embolism Brother   . Colon cancer Neg Hx   . Breast cancer      Maternal Cousin     History  Substance Use Topics  . Smoking status: Never Smoker   . Smokeless tobacco: Never Used  . Alcohol Use: No    OB History    Grav Para Term Preterm Abortions TAB SAB Ect Mult Living                  Review of Systems  Constitutional: Positive for activity change, appetite change and fatigue. Negative for fever.  HENT: Negative for congestion  and rhinorrhea.   Eyes: Negative for visual disturbance.  Respiratory: Negative for cough, chest tightness and shortness of breath.   Cardiovascular: Negative for chest pain.  Gastrointestinal: Positive for nausea and vomiting. Negative for abdominal pain.  Genitourinary: Negative for dysuria and hematuria.  Musculoskeletal: Positive for back pain.  Skin: Negative.   Neurological: Positive for dizziness.    Physical Exam  BP 123/80  Pulse 86  Temp 98 F (36.7 C)  Resp 16  Ht 5\' 3"  (1.6 m)  Wt 183 lb (83.008 kg)  BMI 32.42 kg/m2  SpO2 98%  Physical Exam  Constitutional: She is oriented to person, place, and time. She appears well-developed and well-nourished. No distress.       Tearful and withdrawn  HENT:  Head: Normocephalic and atraumatic.  Mouth/Throat: Oropharynx is clear and moist. No oropharyngeal exudate.  Eyes: Conjunctivae are normal. Pupils are equal, round, and reactive to light.  Neck: Normal range of motion.  Cardiovascular: Normal rate, regular rhythm and normal heart sounds.   Pulmonary/Chest: Effort normal and breath sounds normal. No respiratory distress.  Abdominal: Soft. There is no tenderness. There is no rebound and no guarding.  Musculoskeletal: Normal range of motion. She exhibits no edema and no tenderness.  Neurological: She is alert and oriented to person, place, and time. No cranial nerve deficit.  Skin: Skin is warm.  Psychiatric: Her mood appears anxious. She is slowed and withdrawn. Cognition and memory are impaired. She exhibits a depressed mood. She expresses suicidal ideation. She expresses no homicidal ideation. She expresses suicidal plans.    ED Course  Procedures  MDM Chronic medical complaints without evidence of acute exacerbation. I believe the patient's problems today are likely psychiatric.  This is her third ED visit in the past week for vague chronic complaints. She's had extensive medical workup in the past. She was evaluated by  the [facility] a few days ago and discharged. However she is having active suicidal ideation and her husband called Dr. Marina Goodell expressing concern over his wife's well being.  We'll obtain screening laboratory studies and activate the mobile crisis team.       Glynn Octave, MD 11/23/10 [phone]

## 2010-11-23 NOTE — ED Notes (Signed)
Mobile crisis staff called back in-stated he would be here in 1-2 hrs for assessment

## 2010-11-23 NOTE — ED Notes (Signed)
Pt states her GI s/s have been "for months" she has been seen "by 10 doctors"-including GI studies- psych yesterday

## 2010-11-23 NOTE — ED Notes (Signed)
Husband to nse desk-request tylenol for pt HA-[facility]P [facility] notified-orders received

## 2010-11-23 NOTE — ED Notes (Signed)
Pt c/o return of HA-requested tylenol-[facility] notified-orders received

## 2010-11-23 NOTE — ED Notes (Signed)
C/o everytime she eats she vomits "for months"-3 ED visits this week and by PCP-was sent by [facility]-had upper endoscopy and colonoscopy

## 2010-11-23 NOTE — ED Notes (Signed)
Report received from karen RN

## 2010-11-23 NOTE — ED Notes (Signed)
Mobile Crisis member in to see pt

## 2010-11-23 NOTE — ED Notes (Signed)
Spoke with Mobile Crisis coordinator-will have staff call back

## 2010-11-24 NOTE — ED Notes (Signed)
Pt states she still has thoughts of hurting herself. States "im just not ready to go home. When im at home, i lay around all day, and that is no way to live. All i can think about is how i have to get out of this situation. And then i think about ending everything." pt admits to still having thoughts of SI. Denies HI. Pt is calm and cooperative in the room at this time. Answers questions appropriately. Pt continues to refuse offers of additional food/fluids or blankets. Pt states "i just want to sleep"

## 2010-11-24 NOTE — ED Notes (Signed)
Per nurse, I asked patient if she wanted food, etc. Patient wanted corn flakes with milk and water.

## 2010-11-24 NOTE — ED Notes (Signed)
Staff member from [facility] stated they still did not have any available facilities for patient. Still waiting to hear back from 3 more facilities. Pt will most likely stay here overnight, and will hear back in the morning.

## 2010-11-24 NOTE — ED Notes (Signed)
Karie Georges with mobile crisis is here speaking with pt and attempting to find placement

## 2010-11-24 NOTE — ED Notes (Signed)
Pt now waiting for her husband to come and bring her some clothes will be discharged at that time to go to [facility] in [place] pt states she is agreeable to being admitted there for evaluation and treatment.pt is voluntary. Sitter remains at the bedside

## 2010-11-24 NOTE — ED Notes (Signed)
[facility] called states still working on bed placement will notify us of progress

## 2010-11-24 NOTE — ED Notes (Signed)
Pt accepted at [facility] to psych bed Miranda [name] with [facility] is transporting this pt who is agreeable to go with his since her husband is at work and unable to take her Pts husband states he will agree to go and pick this pt up when she is discharged. pts family has all of her belongings copy of notes and labs sent with [facility] counelor

## 2010-11-24 NOTE — ED Notes (Signed)
Patient has had no complaints overnight, mobile crisis has seen patient and no beds available at this time, has made application to 3 facilities across the state pending bed availability. There is no current availability for transfer to [facility] psychiatric holding. Patient comfortable at this time.  Vida Roller, MD 11/24/10 [phone]

## 2010-11-24 NOTE — ED Provider Notes (Signed)
History     CSN: [id number] Arrival date & time: 11/23/2010 11:37 AM  Chief Complaint  Patient presents with  . Emesis  . Anxiety   HPI  Past Medical History  Diagnosis Date  . Cough   . Urticaria   . Asthma   . Allergic rhinitis   . GERD (gastroesophageal reflux disease)   . Hyperlipemia   . Small vessel disease   . Hypertension   . Diarrhea   . Migraine   . Vertigo   . Anxiety   . Depression     Past Surgical History  Procedure Date  . Rhinoplasty   . Septoplasty   . Lumbar spine surgery     x4   . Cholecystectomy   . Total abdominal hysterectomy     Family History  Problem Relation Age of Onset  . Pulmonary embolism Brother   . Colon cancer Neg Hx   . Breast cancer      Maternal Cousin     History  Substance Use Topics  . Smoking status: Never Smoker   . Smokeless tobacco: Never Used  . Alcohol Use: No    OB History    Grav Para Term Preterm Abortions TAB SAB Ect Mult Living                  Review of Systems  Physical Exam  BP 124/71  Pulse 63  Temp(Src) 97.9 F (36.6 C) (Oral)  Resp 20  Ht 5\' 3"  (1.6 m)  Wt 183 lb (83.008 kg)  BMI 32.42 kg/m2  SpO2 98%  Physical Exam  ED Course  Procedures  MDM Patient is tentatively accepted at [facility] in [place]. Transport by family normal crisis counselor      Theron Arista A. Patrica Duel, MD 11/24/10 1009

## 2010-11-24 NOTE — ED Notes (Signed)
Report given to Melissa RN

## 2010-11-24 NOTE — ED Notes (Signed)
Pt resting quietly with no complaints at present. Sitter at the bedside

## 2010-11-24 NOTE — ED Notes (Addendum)
Spoke with Staff Member from [facility], who was requesting additional information regarding pt's behavior in ER tonight. Updated them on pt situation and stay in ER. Stated they would contact the physician and request admission to their facility. Will call back shortly.

## 2010-11-24 NOTE — ED Notes (Signed)
Pt awake alert and oriented pt eating corn flakes pt is in paper scrubs has been evaluated by [facility]. MD Tucich contacted dr Sunnie Nielsen and dr Cletis [place] knapp at [facility] er states ok to send to [facility] so she will have access to tele psych as she has been here 22 hours and has been medically cleared. Will contact charge rn at [facility] ed and [facility] to facilitate transport

## 2010-11-24 NOTE — ED Notes (Signed)
Pt ambulatory to restroom. No difficulty. Pt assisted back into bed. Sitter at bedside. Pt denied offer of additional blankets or fluids

## 2010-11-24 NOTE — ED Notes (Signed)
Contacted charge RN at [facility] ED states unable to accept this pt at present due to number of patients already holding and number of staff available. MD [place] notified. Sitter has arrived and is at bedside with pt.

## 2010-11-24 NOTE — ED Notes (Addendum)
Miranda [name] from [facility] called back stating the physician was denying the admission d/t he felt like the pt needed an environment that was "less constrictive". Verbalized understanding.

## 2010-11-24 NOTE — ED Notes (Signed)
Call received from [facility] requesting labs and medication list. Information faxed to [facility]. Pt resting quietly, no complaints. Sitter at bedside.

## 2010-11-24 NOTE — ED Notes (Signed)
Staffing called stating they did not have a sitter to come out in the morning. Recommended pt be sent to [facility] psych ED for holding. MD made aware.

## 2011-05-12 ENCOUNTER — Encounter: Payer: Self-pay | Admitting: Internal Medicine

## 2011-05-12 ENCOUNTER — Ambulatory Visit (INDEPENDENT_AMBULATORY_CARE_PROVIDER_SITE_OTHER): Payer: BC Managed Care – PPO | Admitting: Internal Medicine

## 2011-05-12 VITALS — BP 140/100 | HR 97 | Ht 63.5 in | Wt 208.2 lb

## 2011-05-12 DIAGNOSIS — J309 Allergic rhinitis, unspecified: Secondary | ICD-10-CM

## 2011-05-12 DIAGNOSIS — H6980 Other specified disorders of Eustachian tube, unspecified ear: Secondary | ICD-10-CM

## 2011-05-12 DIAGNOSIS — J45909 Unspecified asthma, uncomplicated: Secondary | ICD-10-CM

## 2011-05-12 MED ORDER — HYDROCODONE-HOMATROPINE 5-1.5 MG/5ML PO SYRP: 5.0000 mL | ORAL_SOLUTION | Freq: Four times a day (QID) | ORAL | Status: AC | PRN

## 2011-05-12 MED ORDER — AZITHROMYCIN 250 MG PO TABS: ORAL_TABLET | ORAL | Status: AC

## 2011-05-12 MED ORDER — ACRIVASTINE-PSEUDOEPHEDRINE 8-60 MG PO CAPS: 1.0000 | ORAL_CAPSULE | Freq: Two times a day (BID) | ORAL | Status: AC | PRN

## 2011-05-12 MED ORDER — LEVALBUTEROL TARTRATE 45 MCG/ACT IN AERO: 2.0000 | INHALATION_SPRAY | Freq: Four times a day (QID) | RESPIRATORY_TRACT | Status: AC | PRN

## 2011-05-12 MED ORDER — AZELASTINE HCL 0.15 % NA SOLN: 1.0000 | Freq: Every day | NASAL | Status: DC

## 2011-05-12 NOTE — Patient Instructions (Signed)
We discussed medicine interactions  Scripts written/ refills  Script to try Semprex-D as an antihistamine/ decongestant

## 2011-05-12 NOTE — Progress Notes (Signed)
05/12/11- 60 yoF followed for hx allergic asthma, allergic rhinitis, GERD LOV-04/19/10  Pt states having sinus drainage,productive cough,sob,wheezing has been using neb more this week been using out dated inhaler. also has been on z pak  She is being evaluated for vertigo with migraine and headache. Was sent to a cardiologist who monitored her rhythm become a problem. Evaluated at [facility] and then at [facility]. [place]/ENT. He did vestibular evaluation "abnormal". Many falls. Hospitalized in [place] for what may been a behavioral health problem. Multiple doctor visits and health problems. Through all of this she says asthma control has been good. Recently caught a cold with bronchitis from her husband. Brother and granddaughter both have history of pulmonary embolism but she doesn't know about any workup for family chemistry problems. Takes valium and remeron to reduce reflux ( does this reflect an emotional basis for her reflux?) ROS-see HPI Constitutional:   No-   weight loss, night sweats, fevers, chills, fatigue, lassitude. HEENT:   No-  headaches, difficulty swallowing, tooth/dental problems, sore throat,       No-  sneezing, itching, ear ache, nasal congestion, post nasal drip,  CV:  No-   chest pain, orthopnea, PND, swelling in lower extremities, anasarca, +dizziness, palpitations Resp: No-   shortness of breath with exertion or at rest.              No-   productive cough,  No non-productive cough,  No- coughing up of blood.              No-   change in color of mucus.  No- wheezing.   Skin: No-   rash or lesions. GI:  No-   heartburn, indigestion, abdominal pain, nausea, vomiting,                  GU: No-   dysuria, . MS:  No-   joint pain or swelling.    No- back pain. Neuro-     nothing unusual Psych:  No- change in mood or affect. +No depression or anxiety.  No memory loss.  OBJ- Physical Exam General- Alert, Oriented, Affect-appropriate, Distress- none acute Skin-  rash-none, lesions- none, excoriation- none Lymphadenopathy- none Head- atraumatic            Eyes- Gross vision intact, PERRLA, conjunctivae and secretions clear            Ears- Hearing, canals-normal            Nose- Clear, no-Septal dev, mucus, polyps, erosion, perforation             Throat- Mallampati II , mucosa clear , drainage- none, tonsils- atrophic Neck- flexible , trachea midline, no stridor , thyroid nl, carotid no bruit Chest - symmetrical excursion , unlabored           Heart/CV- RRR , no murmur , no gallop  , no rub, nl s1 s2                           - JVD- none , edema- none, stasis changes- none, varices- none           Lung- clear to P&A, wheeze- none, cough- none , dullness-none, rub- none           Chest wall-  Abd- tender-no, distended-no, bowel sounds-present, HSM- no Br/ Gen/ Rectal- Not done, not indicated Extrem- cyanosis- none, clubbing, none, atrophy- none, strength- nl Neuro- grossly intact to observation

## 2011-05-16 NOTE — Assessment & Plan Note (Signed)
Controlled rhinitis. Plan refill Astepro, Semprex-D, Z pak to hold

## 2011-05-16 NOTE — Assessment & Plan Note (Signed)
She gives a matter of fact report of a complex series of medical visits over the past year. Strong impression that anxiety/mood issues may be important. Plan refill rescue inhaler

## 2011-05-16 NOTE — Assessment & Plan Note (Signed)
Abnormal vestibular function identified by ENT at [facility]. She will return there as needed.

## 2011-10-16 ENCOUNTER — Other Ambulatory Visit: Payer: Self-pay | Admitting: Gastroenterology

## 2011-10-16 DIAGNOSIS — R1084 Generalized abdominal pain: Secondary | ICD-10-CM

## 2011-10-18 ENCOUNTER — Other Ambulatory Visit: Payer: BC Managed Care – PPO

## 2012-04-03 ENCOUNTER — Ambulatory Visit
Admission: RE | Admit: 2012-04-03 | Discharge: 2012-04-03 | Disposition: A | Discharge status: Home / Self Care | Payer: BC Managed Care – PPO | Source: Ambulatory Visit | Attending: Gastroenterology | Admitting: Gastroenterology

## 2012-04-03 DIAGNOSIS — R1084 Generalized abdominal pain: Secondary | ICD-10-CM

## 2012-04-04 ENCOUNTER — Telehealth: Payer: Self-pay | Admitting: Internal Medicine

## 2012-04-04 NOTE — Telephone Encounter (Signed)
Miranda [name] had an opening on Monday at 3:45. Pt coming in at that time. Nothing further was needed

## 2012-04-08 ENCOUNTER — Ambulatory Visit: Payer: BC Managed Care – PPO | Admitting: Internal Medicine

## 2012-04-11 ENCOUNTER — Other Ambulatory Visit: Payer: Self-pay | Admitting: Gastroenterology

## 2012-04-11 DIAGNOSIS — R109 Unspecified abdominal pain: Secondary | ICD-10-CM

## 2012-04-15 NOTE — Progress Notes (Signed)
Blood drawn for from right [facility] space for labs without difficulty; site unremarkable.  Donell Sievert, RN

## 2012-04-17 ENCOUNTER — Ambulatory Visit
Admission: RE | Admit: 2012-04-17 | Discharge: 2012-04-17 | Disposition: A | Discharge status: Home / Self Care | Payer: BC Managed Care – PPO | Source: Ambulatory Visit | Attending: Gastroenterology | Admitting: Gastroenterology

## 2012-04-17 DIAGNOSIS — R109 Unspecified abdominal pain: Secondary | ICD-10-CM

## 2012-04-17 MED ORDER — GADOBENATE DIMEGLUMINE 529 MG/ML IV SOLN
19.0000 mL | Freq: Once | INTRAVENOUS | Status: AC | PRN
  Administered 2012-04-17: 19 mL via INTRAVENOUS

## 2012-06-05 ENCOUNTER — Encounter (HOSPITAL_COMMUNITY)
Admission: RE | Disposition: A | Discharge status: Home / Self Care | Payer: Self-pay | Source: Ambulatory Visit | Attending: Gastroenterology

## 2012-06-05 ENCOUNTER — Ambulatory Visit (HOSPITAL_COMMUNITY)
Admission: RE | Admit: 2012-06-05 | Discharge: 2012-06-05 | Disposition: A | Discharge status: Home / Self Care | Payer: BC Managed Care – PPO | Source: Ambulatory Visit | Attending: Gastroenterology | Admitting: Gastroenterology

## 2012-06-05 DIAGNOSIS — K2970 Gastritis, unspecified, without bleeding: Secondary | ICD-10-CM | POA: Insufficient documentation

## 2012-06-05 DIAGNOSIS — K219 Gastro-esophageal reflux disease without esophagitis: Secondary | ICD-10-CM | POA: Insufficient documentation

## 2012-06-05 HISTORY — PX: BRAVO PH STUDY: SHX5421

## 2012-06-05 HISTORY — PX: ESOPHAGOGASTRODUODENOSCOPY: SHX5428

## 2012-06-05 SURGERY — EGD (ESOPHAGOGASTRODUODENOSCOPY)
Anesthesia: Moderate Sedation

## 2012-06-05 MED ORDER — FENTANYL CITRATE 0.05 MG/ML IJ SOLN
INTRAMUSCULAR | Status: DC | PRN
  Administered 2012-06-05 (×4): 25 ug via INTRAVENOUS

## 2012-06-05 MED ORDER — MIDAZOLAM HCL 10 MG/2ML IJ SOLN
INTRAMUSCULAR | Status: DC | PRN
  Administered 2012-06-05 (×3): 2 mg via INTRAVENOUS
  Administered 2012-06-05: 1 mg via INTRAVENOUS
  Administered 2012-06-05: 2 mg via INTRAVENOUS

## 2012-06-05 MED ORDER — DIPHENHYDRAMINE HCL 50 MG/ML IJ SOLN
INTRAMUSCULAR | Status: DC | PRN
  Administered 2012-06-05 (×2): 25 mg via INTRAVENOUS

## 2012-06-05 MED ORDER — DIPHENHYDRAMINE HCL 50 MG/ML IJ SOLN
INTRAMUSCULAR | Status: AC
  Filled 2012-06-05: qty 1

## 2012-06-05 MED ORDER — MIDAZOLAM HCL 10 MG/2ML IJ SOLN
INTRAMUSCULAR | Status: AC
  Filled 2012-06-05: qty 2

## 2012-06-05 MED ORDER — FENTANYL CITRATE 0.05 MG/ML IJ SOLN
INTRAMUSCULAR | Status: AC
  Filled 2012-06-05: qty 2

## 2012-06-05 MED ORDER — BUTAMBEN-TETRACAINE-BENZOCAINE 2-2-14 % EX AERO
INHALATION_SPRAY | CUTANEOUS | Status: DC | PRN
  Administered 2012-06-05: 2 via TOPICAL

## 2012-06-05 MED ORDER — SODIUM CHLORIDE 0.9 % IV SOLN
INTRAVENOUS | Status: DC
  Administered 2012-06-05: 11:00:00 via INTRAVENOUS

## 2012-06-05 NOTE — Op Note (Signed)
[facility] [address] [place], [address]   ENDOSCOPY PROCEDURE REPORT  PATIENT: Miranda [name], Miranda [name]  MR#: [id number] BIRTHDATE: [birth year] , 61  yrs. old GENDER: Female  ENDOSCOPIST: Charlott Rakes, MD REFERRED XB:[id number] Rankins, M.D.  PROCEDURE DATE:  06/05/2012 PROCEDURE:   EGD w/ Bravo capsule placement ASA CLASS:   Class II INDICATIONS:history of GERD. MEDICATIONS: Fentanyl 100 mcg IV, Versed 9 mg IV, Diphenhydramine (Benadryl) 50 mg IV, and Cetacaine spray x 2  TOPICAL ANESTHETIC:  DESCRIPTION OF PROCEDURE:   After the risks benefits and alternatives of the procedure were thoroughly explained, informed consent was obtained.  The     endoscope was introduced through the mouth and advanced to the second portion of the duodenum , limited by Without limitations.   The instrument was slowly withdrawn as the mucosa was fully examined.     FINDINGS: The endoscope was inserted into the oropharynx and esophagus was intubated.  The gastroesophageal junction was noted to be 37 cm from the incisors.   Endoscope was advanced into the stomach, which revealed a normal-appearing stomach body. Minimal erythema in the distal stomach consistent with minimal gastritis. The endoscope was advanced to the duodenal bulb and second portion of duodenum which were unremarkable.  The endoscope was withdrawn back into the stomach and retroflexion was unremarkable. The endoscope was withdrawn and a Bravo capsule catheter was inserted and the Bravo capsule was deployed at 31cm (6 cm above the GEJ) in the usual fashion. The endoscope was reinserted and confirmed adequate placement of the Bravo capsule and it noted to be working properly and recording the pH.  COMPLICATIONS: None  ENDOSCOPIC IMPRESSION:     S/P Bravo capsule placement; Minimal antral gastritis  RECOMMENDATIONS: F/U on Bravo capsule results (study done OFF of PPIs)   REPEAT  EXAM: N/A  _______________________________ Charlott Rakes, MD eSigned:  Charlott Rakes, MD 06/05/2012 12:03 PM    CC:  PATIENT NAME:  Miranda [name], Miranda [name] MR#: [id number]

## 2012-06-05 NOTE — Interval H&P Note (Signed)
History and Physical Interval Note:  06/05/2012 11:29 AM  Miranda [name]  has presented today for surgery, with the diagnosis of reflux  The various methods of treatment have been discussed with the patient and family. After consideration of risks, benefits and other options for treatment, the patient has consented to  Procedure(s): ESOPHAGOGASTRODUODENOSCOPY (EGD) (N/A) BRAVO PH STUDY (N/A) as a surgical intervention .  The patient's history has been reviewed, patient examined, no change in status, stable for surgery.  I have reviewed the patient's chart and labs.  Questions were answered to the patient's satisfaction.     Berkley Cronkright C.

## 2012-06-05 NOTE — H&P (Signed)
  Date of Initial H&P: 05/30/12  History reviewed, patient examined, no change in status, stable for surgery. EGD with Bravo capsule placement due to esophageal reflux.

## 2012-06-06 ENCOUNTER — Encounter (HOSPITAL_COMMUNITY): Payer: Self-pay | Admitting: Gastroenterology

## 2012-06-12 ENCOUNTER — Ambulatory Visit
Admission: RE | Admit: 2012-06-12 | Discharge: 2012-06-12 | Disposition: A | Discharge status: Home / Self Care | Payer: BC Managed Care – PPO | Source: Ambulatory Visit | Attending: Gastroenterology | Admitting: Gastroenterology

## 2012-06-12 ENCOUNTER — Other Ambulatory Visit: Payer: Self-pay | Admitting: Gastroenterology

## 2012-06-12 DIAGNOSIS — R079 Chest pain, unspecified: Secondary | ICD-10-CM

## 2013-02-10 ENCOUNTER — Other Ambulatory Visit: Payer: Self-pay | Admitting: Gynecology

## 2014-04-05 IMAGING — CT CT ABD-PELV W/O CM
3 of 4 series · 13 of 36 positions shown, 19 images · IV contrast (READICAT/WATER)
Comparison: None.

CLINICAL DATA: Generalized abdominal pain with diarrhea.

CT ABDOMEN AND PELVIS WITHOUT CONTRAST
TECHNIQUE: Multidetector CT imaging of the abdomen and pelvis was
performed following the standard protocol without intravenous
contrast.

[Series 3: abd/pelvis w/o · axial · non-contrast · 0.81mm/px · z∈[-374,-44]mm · 8 of 86 slices shown, 13 images]
[im 10/86  soft-tissue]
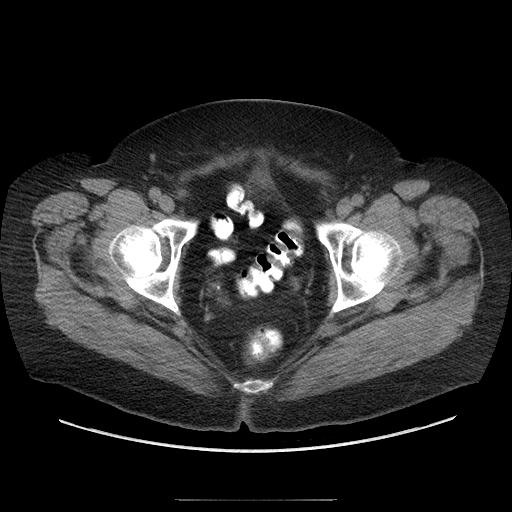
[im 10/86  bone]
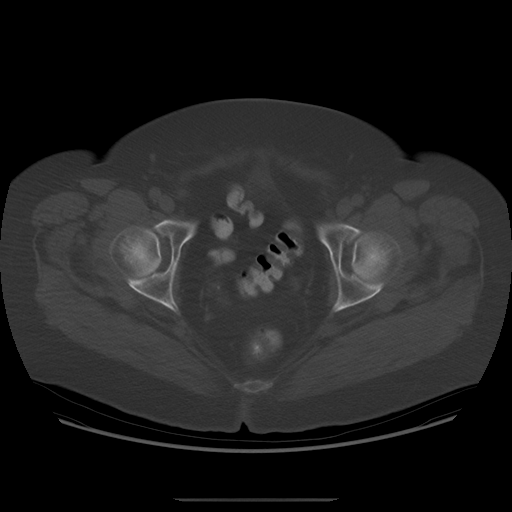
[im 19/86  soft-tissue]
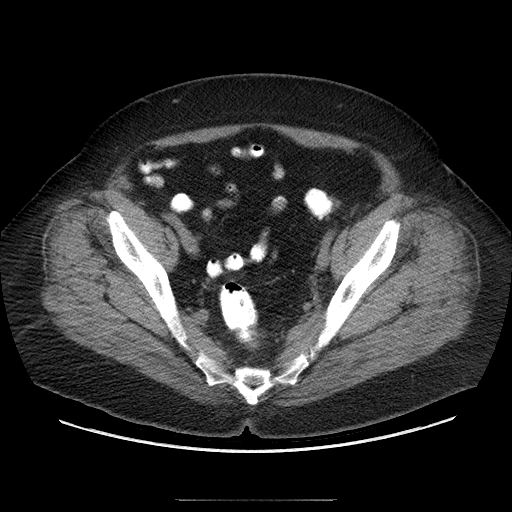
[im 29/86  soft-tissue]
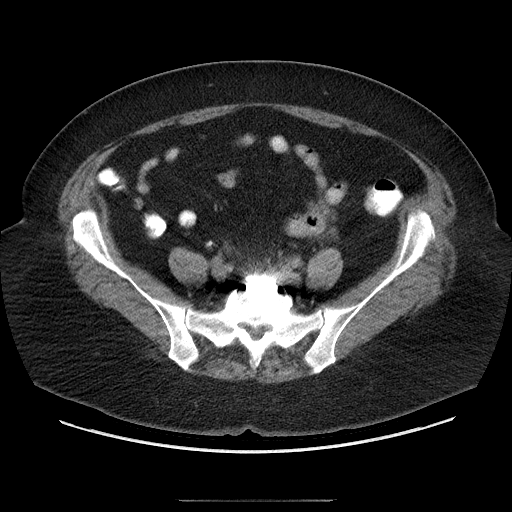
[im 38/86  soft-tissue]
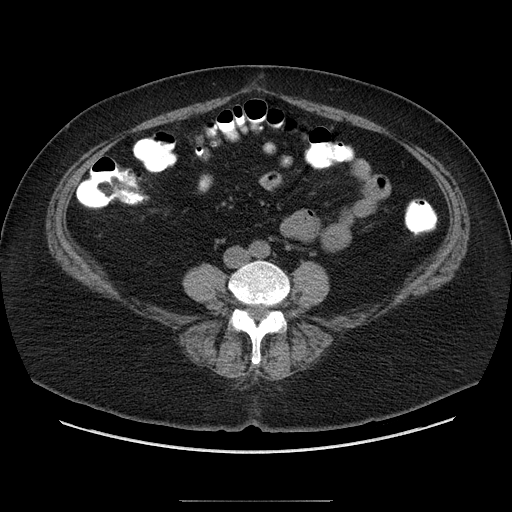
[im 48/86  soft-tissue]
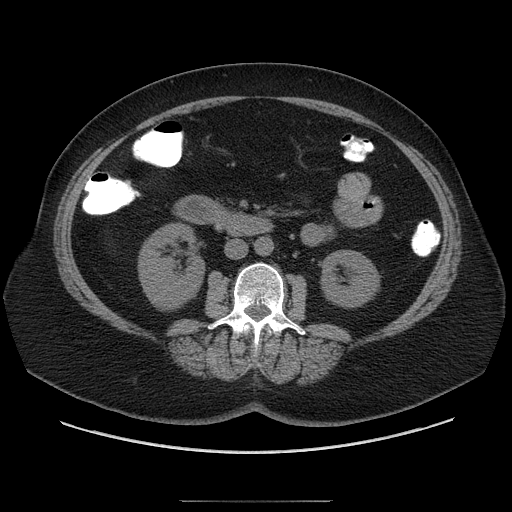
[im 48/86  lung]
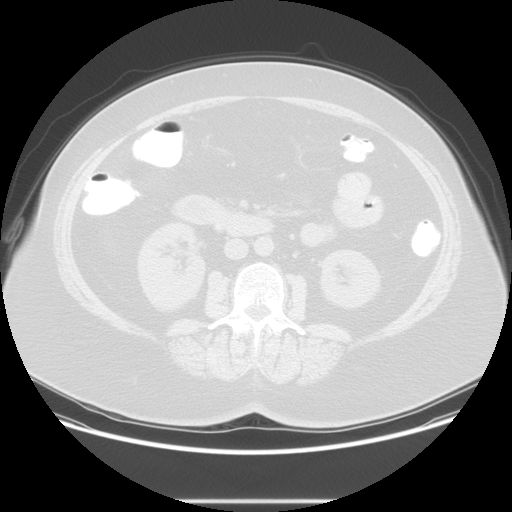
[im 57/86  soft-tissue]
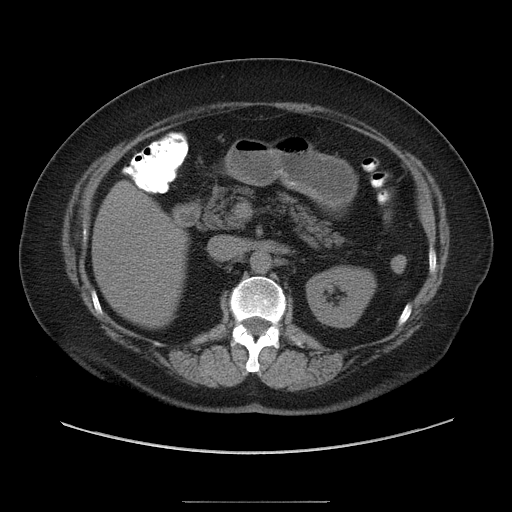
[im 57/86  lung]
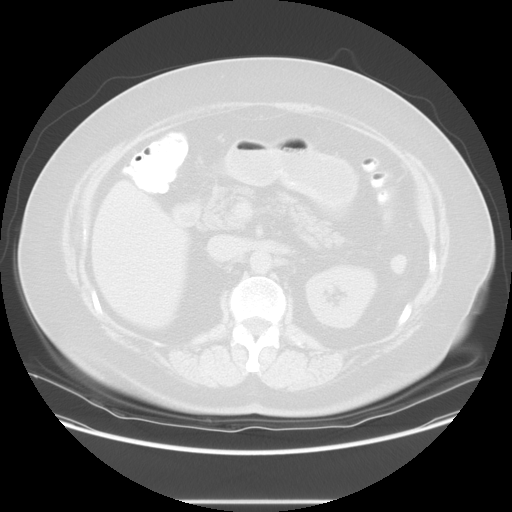
[im 67/86  soft-tissue]
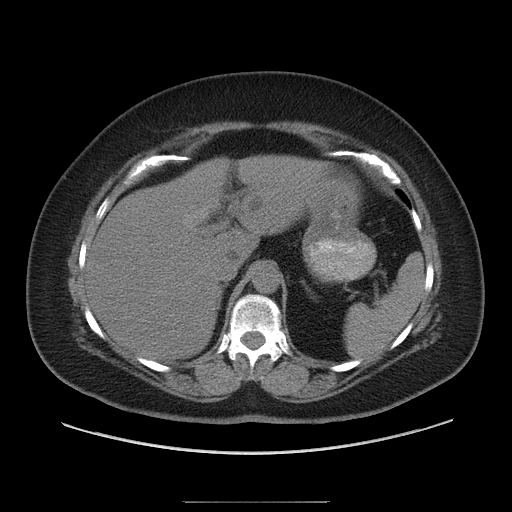
[im 67/86  lung]
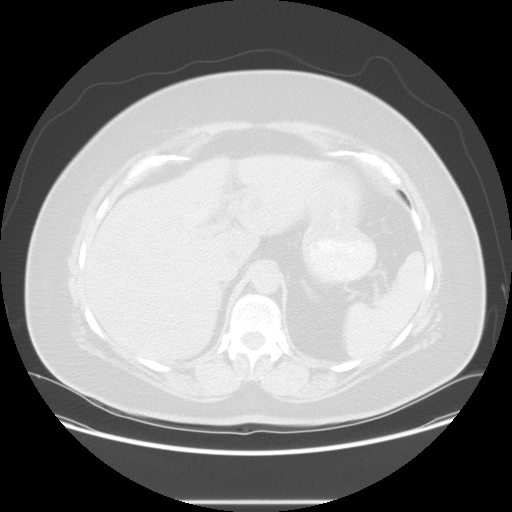
[im 76/86  soft-tissue]
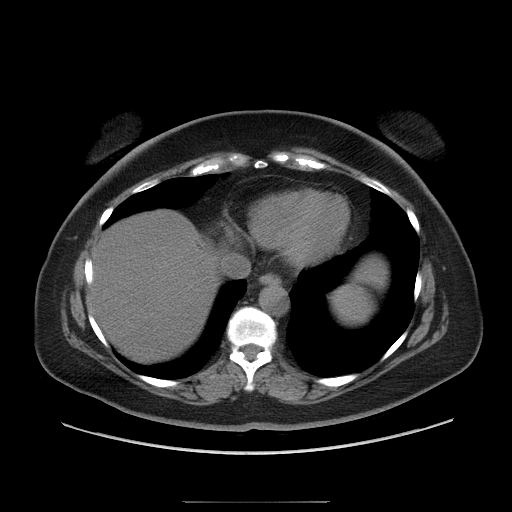
[im 76/86  lung]
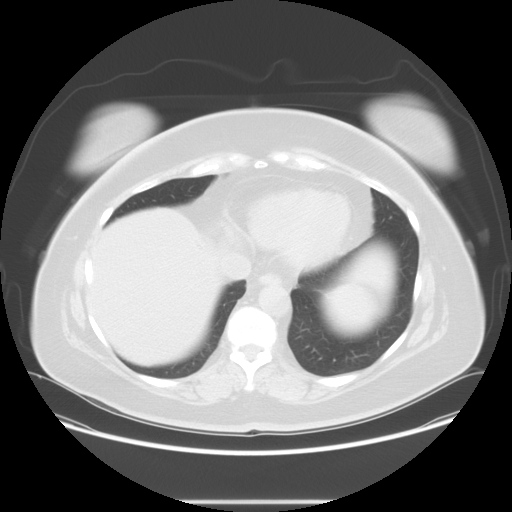

[Series 601: coronal body · coronal · 0.85mm/px · 1 of 127 slices shown, 2 images]
[im 43/127  soft-tissue]
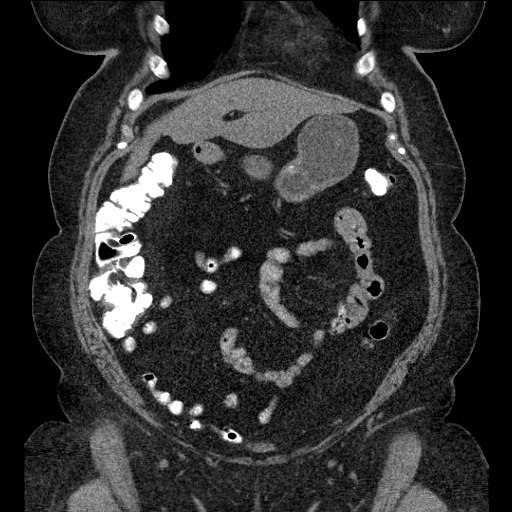
[im 43/127  bone]
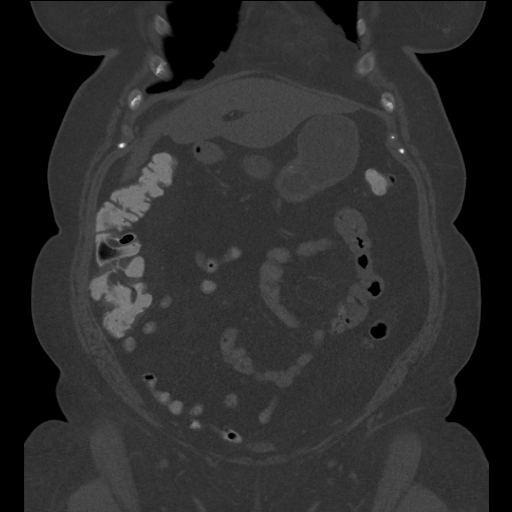

[Series 602: sagittal body · sagittal · 0.85mm/px · 4 of 167 slices shown]
[im 19/167  soft-tissue]
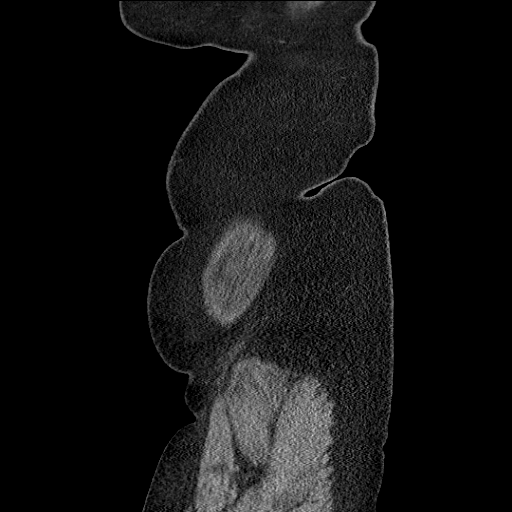
[im 37/167  soft-tissue]
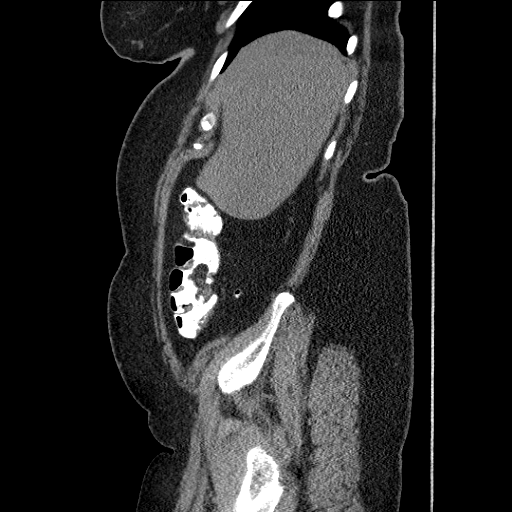
[im 56/167  soft-tissue]
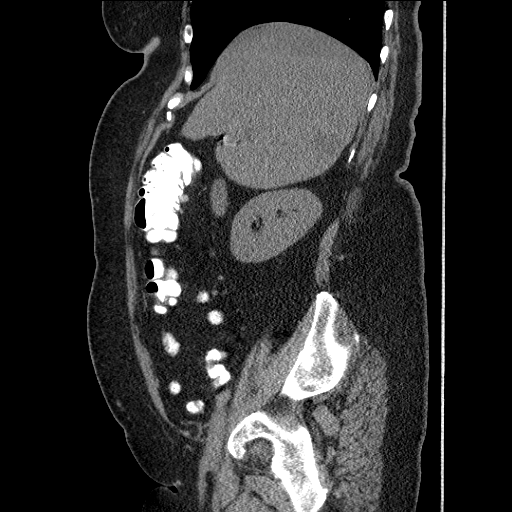
[im 74/167  soft-tissue]
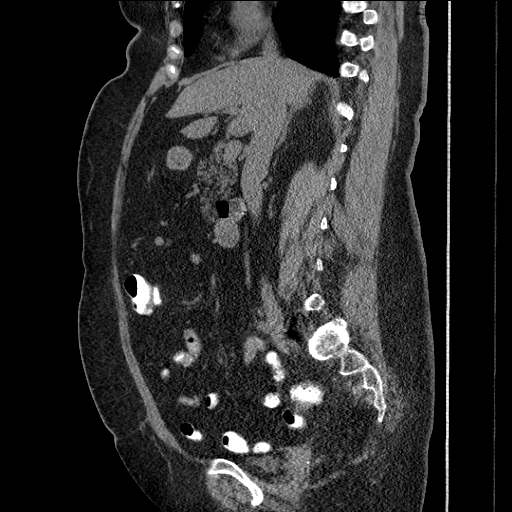

[13 of 36 positions shown; findings below may reference images not displayed]

FINDINGS: Lung bases show no acute findings.  Heart size normal.
No pericardial or pleural effusion.

Low attenuation lesions in the liver measure up to 2.6 cm in the
right hepatic lobe.  Cholecystectomy.  Adrenal glands and right
kidney are unremarkable.  A 1.2 cm low attenuation lesion in the
left kidney is difficult to characterize without IV contrast.
Spleen, pancreas and stomach are unremarkable.  Incidental note is
made of a duodenal diverticulum.  Small bowel and colon are
otherwise unremarkable.

No pathologically enlarged lymph nodes.  No free fluid.
Hysterectomy.  Trace atherosclerotic calcification of the abdominal
aorta.  Tiny periumbilical hernia contains fat.  Postoperative
changes are seen in the spine.  No worrisome lytic or sclerotic
lesions.
IMPRESSION: 1.  No findings to explain the patient's given symptoms.
2.  Scattered low attenuation lesions in the liver are
indeterminate. In the absence of known malignancy, cysts and/or
hemangiomas are likely.

## 2020-12-07 ENCOUNTER — Encounter: Payer: Self-pay | Admitting: Internal Medicine

## 2021-03-17 ENCOUNTER — Other Ambulatory Visit: Payer: Self-pay

## 2021-03-17 ENCOUNTER — Encounter (HOSPITAL_BASED_OUTPATIENT_CLINIC_OR_DEPARTMENT_OTHER): Payer: Self-pay | Admitting: Obstetrics and Gynecology

## 2021-03-17 ENCOUNTER — Emergency Department (HOSPITAL_BASED_OUTPATIENT_CLINIC_OR_DEPARTMENT_OTHER)
Admission: EM | Admit: 2021-03-17 | Discharge: 2021-03-17 | Disposition: A | Discharge status: Home / Self Care | Payer: Medicare PPO | Attending: Emergency Medicine | Admitting: Emergency Medicine

## 2021-03-17 ENCOUNTER — Emergency Department (HOSPITAL_BASED_OUTPATIENT_CLINIC_OR_DEPARTMENT_OTHER): Payer: Medicare PPO | Admitting: Radiology

## 2021-03-17 DIAGNOSIS — D72829 Elevated white blood cell count, unspecified: Secondary | ICD-10-CM | POA: Diagnosis not present

## 2021-03-17 DIAGNOSIS — B349 Viral infection, unspecified: Secondary | ICD-10-CM | POA: Diagnosis not present

## 2021-03-17 DIAGNOSIS — Z20822 Contact with and (suspected) exposure to covid-19: Secondary | ICD-10-CM | POA: Diagnosis not present

## 2021-03-17 DIAGNOSIS — R0602 Shortness of breath: Secondary | ICD-10-CM | POA: Diagnosis present

## 2021-03-17 DIAGNOSIS — E876 Hypokalemia: Secondary | ICD-10-CM | POA: Insufficient documentation

## 2021-03-17 LAB — CBC WITH DIFFERENTIAL/PLATELET
Abs Immature Granulocytes: 0.05 10*3/uL (ref 0.00–0.07)
Basophils Absolute: 0.1 10*3/uL (ref 0.0–0.1)
Basophils Relative: 1 %
Eosinophils Absolute: 0.4 10*3/uL (ref 0.0–0.5)
Eosinophils Relative: 3 %
HCT: 44.4 % (ref 36.0–46.0)
Hemoglobin: 14.2 g/dL (ref 12.0–15.0)
Immature Granulocytes: 0 %
Lymphocytes Relative: 17 %
Lymphs Abs: 2.5 10*3/uL (ref 0.7–4.0)
MCH: 29 pg (ref 26.0–34.0)
MCHC: 32 g/dL (ref 30.0–36.0)
MCV: 90.8 fL (ref 80.0–100.0)
Monocytes Absolute: 1.2 10*3/uL — ABNORMAL HIGH (ref 0.1–1.0)
Monocytes Relative: 8 %
Neutro Abs: 10.2 10*3/uL — ABNORMAL HIGH (ref 1.7–7.7)
Neutrophils Relative %: 71 %
Platelets: 393 10*3/uL (ref 150–400)
RBC: 4.89 MIL/uL (ref 3.87–5.11)
RDW: 14 % (ref 11.5–15.5)
WBC: 14.4 10*3/uL — ABNORMAL HIGH (ref 4.0–10.5)
nRBC: 0 % (ref 0.0–0.2)

## 2021-03-17 LAB — COMPREHENSIVE METABOLIC PANEL
ALT: 24 U/L (ref 0–44)
AST: 25 U/L (ref 15–41)
Albumin: 4.1 g/dL (ref 3.5–5.0)
Alkaline Phosphatase: 78 U/L (ref 38–126)
Anion gap: 12 (ref 5–15)
BUN: 14 mg/dL (ref 8–23)
CO2: 26 mmol/L (ref 22–32)
Calcium: 8.9 mg/dL (ref 8.9–10.3)
Chloride: 103 mmol/L (ref 98–111)
Creatinine, Ser: 0.93 mg/dL (ref 0.44–1.00)
GFR, Estimated: >60 mL/min (ref >60)
Glucose, Bld: 107 mg/dL — ABNORMAL HIGH (ref 70–99)
Potassium: 3.2 mmol/L — ABNORMAL LOW (ref 3.5–5.1)
Sodium: 141 mmol/L (ref 135–145)
Total Bilirubin: 1.2 mg/dL (ref 0.3–1.2)
Total Protein: 7 g/dL (ref 6.5–8.1)

## 2021-03-17 LAB — RESP PANEL BY RT-PCR (FLU A&B, COVID) ARPGX2
Influenza A by PCR: NEGATIVE
Influenza B by PCR: NEGATIVE
SARS Coronavirus 2 by RT PCR: NEGATIVE

## 2021-03-17 MED ORDER — BENZONATATE 100 MG PO CAPS: 100.0000 mg | ORAL_CAPSULE | Freq: Three times a day (TID) | ORAL | 0 refills | Status: DC

## 2021-03-17 MED ORDER — DOXYCYCLINE HYCLATE 100 MG PO CAPS: 100.0000 mg | ORAL_CAPSULE | Freq: Two times a day (BID) | ORAL | 0 refills | Status: DC

## 2021-03-17 MED ORDER — BENZONATATE 100 MG PO CAPS
100.0000 mg | ORAL_CAPSULE | Freq: Once | ORAL | Status: AC
  Administered 2021-03-17: 100 mg via ORAL
  Filled 2021-03-17: qty 1

## 2021-03-17 MED ORDER — POTASSIUM CHLORIDE CRYS ER 20 MEQ PO TBCR
40.0000 meq | EXTENDED_RELEASE_TABLET | Freq: Once | ORAL | Status: AC
  Administered 2021-03-17: 40 meq via ORAL
  Filled 2021-03-17: qty 2

## 2021-03-17 MED ORDER — HYDROCODONE-GUAIFENESIN 2.5-200 MG/5ML PO SOLN: ORAL | 0 refills | Status: DC

## 2021-03-17 NOTE — ED Notes (Signed)
Patient verbalizes understanding of discharge instructions. Opportunity for questioning and answers were provided. Patient discharged from ED.  °

## 2021-03-17 NOTE — ED Triage Notes (Signed)
Arrived by EMS. Dx with flu on 1/2. C/o ongoing SOB, weakness, cough.

## 2021-03-17 NOTE — ED Notes (Signed)
Light green and lavender drawn from R AC without complication x1.

## 2021-03-17 NOTE — ED Provider Notes (Signed)
I provided a substantive portion of the care of this patient.  I personally performed the entirety of the history for this encounter.     Patient has had protracted course of cough with empiric diagnosis of influenza and treatment without improvement.  Patient does endorse fevers and chills.  Diagnostic work-up without focal pneumonia.  However with elevated white count and persistent symptoms, consideration for atypical pneumonia.  Agree with doxycycline treatment.   Charlesetta Shanks, MD 03/25/21 [phone]

## 2021-03-17 NOTE — Discharge Instructions (Addendum)
You have been evaluated for your symptoms.  Fortunately your labs are reassuring.  Your potassium is slightly low, please consider eating a banana daily for the next week and have your potassium rechecked.  You may take antibiotic prescribed to cover for potential atypical pneumonia.  You may follow-up with your doctor as needed for further care.  Continue to take Tylenol or ibuprofen for fever or body aches.  Return if you have significant shortness of breath or if you have any concern.

## 2021-03-17 NOTE — ED Provider Notes (Signed)
[facility] EMERGENCY DEPT Provider Note   CSN: [id number] Arrival date & time: 03/17/21  1027     History  Chief Complaint  Patient presents with   Influenza    Miranda [name] is a 71 y.o. female.  The history is provided by the patient. No language interpreter was used.  Influenza  Patient was diagnosed with flu on January 2 who presents today complaining of increased shortness of breath weakness and cough.  Home Medications Prior to Admission medications   Medication Sig Start Date End Date Taking? Authorizing Provider  acetaminophen (TYLENOL) 500 MG tablet Take 1,000-1,500 mg by mouth every 6 (six) hours as needed. pain     [provider]  Azelastine HCl (ASTEPRO) 0.15 % SOLN Place 1 spray into the nose daily. 05/12/11 05/11/12  Baird Lyons D, MD  Diazepam (VALIUM PO) Take 5 mg by mouth daily. 7.5 TO 10 MG PO QD    [provider]  ipratropium (ATROVENT HFA) 17 MCG/ACT inhaler 2 puffs four times a day as needed rescue inhaler     [provider]  levalbuterol (XOPENEX HFA) 45 MCG/ACT inhaler Inhale 2 puffs into the lungs 4 (four) times daily as needed for wheezing or shortness of breath. 05/12/11 05/11/12  Deneise Lever, MD  levalbuterol Penne Lash) 0.63 MG/3ML nebulizer solution Take 1 ampule by nebulization 3 (three) times daily as needed.      [provider]  loratadine (ALAVERT) 10 MG tablet Take 10 mg by mouth daily as needed.     [provider]  mirtazapine (REMERON) 15 MG tablet  04/27/11   [provider]  QUEtiapine (SEROQUEL) 25 MG tablet  04/25/11   [provider]  zolpidem (AMBIEN) 5 MG tablet 5 mg at bedtime as needed.  05/01/11   [provider]      Allergies    Aciphex [rabeprazole sodium], Levofloxacin, Metronidazole, Morphine and related, Nsaids, Ranitidine hcl, Ranitidine hcl, and Sulfonamide derivatives    Review of Systems   Review of Systems  All other systems  reviewed and are negative.  Physical Exam Updated Vital Signs BP 135/78 (BP Location: Left Arm)    Pulse 98    Temp 99.9 F (37.7 C)    Resp 18    SpO2 95%  Physical Exam Vitals and nursing note reviewed.  Constitutional:      General: She is not in acute distress.    Appearance: She is well-developed.  HENT:     Head: Atraumatic.  Eyes:     Conjunctiva/sclera: Conjunctivae normal.  Cardiovascular:     Rate and Rhythm: Normal rate and regular rhythm.     Pulses: Normal pulses.     Heart sounds: Normal heart sounds.  Pulmonary:     Effort: Pulmonary effort is normal.     Breath sounds: No wheezing or rhonchi.  Musculoskeletal:     Cervical back: Neck supple.     Right lower leg: No edema.     Left lower leg: No edema.  Skin:    Findings: No rash.  Neurological:     Mental Status: She is alert.  Psychiatric:        Mood and Affect: Mood normal.    ED Results / Procedures / Treatments   Labs (all labs ordered are listed, but only abnormal results are displayed) Labs Reviewed  CBC WITH DIFFERENTIAL/PLATELET - Abnormal; Notable for the following components:      Result Value   WBC 14.4 (*)  Neutro Abs 10.2 (*)    Monocytes Absolute 1.2 (*)    All other components within normal limits  COMPREHENSIVE METABOLIC PANEL - Abnormal; Notable for the following components:   Potassium 3.2 (*)    Glucose, Bld 107 (*)    All other components within normal limits  RESP PANEL BY RT-PCR (FLU A&B, COVID) [id number]    EKG None  Radiology DG Chest 2 View  Result Date: 03/17/2021 CLINICAL DATA:  Flu diagnosis Monday, getting worse, weakness, cough EXAM: CHEST - 2 VIEW COMPARISON:  Chest radiograph 06/12/2012 FINDINGS: The cardiomediastinal silhouette is within normal limits. There is no focal consolidation or pulmonary edema. There is no pleural effusion or pneumothorax. There is no acute osseous abnormality. IMPRESSION: No radiographic evidence of acute cardiopulmonary process.  Electronically Signed   By: Valetta Mole M.D.   On: 03/17/2021 11:28    Procedures Procedures    Medications Ordered in ED Medications  benzonatate (TESSALON) capsule 100 mg (100 mg Oral Given 03/17/21 1254)  potassium chloride SA (KLOR-CON M) CR tablet 40 mEq (40 mEq Oral Given 03/17/21 1247)    ED Course/ Medical Decision Making/ A&P                           Medical Decision Making  BP 135/78 (BP Location: Left Arm)    Pulse 98    Temp 99.9 F (37.7 C)    Resp 18    SpO2 95%   12:04 PM This is a 71 year old female significant history of asthma, recurrent bronchitis, hypertension, GERD, brought here via EMS from home for evaluation of shortness of weakness and cough.  Patient endorsed for the past week she has had fever chills generalized fatigue, chest congestion, persistent cough, having difficulty sleeping, and having decreased appetite.  She endorsed overall not feeling well.  She was seen at the urgent care center a couple days ago for her complaint.  She had a negative strep test and she has a flu test obtained and was told that her symptom is likely due to the flu.  COVID test was not obtained at that time.  Patient was given steroid, cough medication, and Tamiflu.  She took this medication but states it provided no relief.  Her symptoms is moderate to severe thus prompting this ER visit.  She has history of asthma and does use her inhaler on occasion but denies any significant wheezing.  She is here requesting for antibiotic and states that it usually helps her in the past.  I have reviewed the patient prior chart, including past medical history and included in my decision making.  Patient does have a temperature of 99.9 however she is not hypoxic and blood pressure is within normal range.  She is overall well-appearing, speaking in complete sentences.  I have low suspicion for acute asthma exacerbation in the setting of cold symptoms.  Low suspicion for ACS, PE, or other acute emergent  medical condition.  She has an elevated white count of 14.4 which could be related to an ongoing infection but recent steroid use can certainly cause a rise in her WBC.  Mild hypokalemia with potassium of 3.2, will give supplementation.  Chest x-ray which was reviewed and independently interpreted by me showing no evidence of acute cardiopulmonary process.  1:15 PM I have discussed this finding with patient and also have discussed with Dr. Johnney Killian.  When considering that his symptom has been ongoing for more than a  week and she is still having quite a bit of symptoms, it may be prudent to treat for atypical pneumonia with antibiotic.  Patient is allergic to red dye in Z-Pak in the past and therefore she is amenable for doxycycline as an antibiotic treatment.  She understands to return to the ER if her symptoms progress but otherwise patient is stable for discharge.        Final Clinical Impression(s) / ED Diagnoses Final diagnoses:  Viral illness  Hypokalemia    Rx / DC Orders ED Discharge Orders          Ordered    doxycycline (VIBRAMYCIN) 100 MG capsule  2 times daily,   Status:  Discontinued        03/17/21 1331    benzonatate (TESSALON) 100 MG capsule  Every 8 hours,   Status:  Discontinued        03/17/21 1331    HYDROcodone-guaiFENesin 2.5-200 MG/5ML SOLN        03/17/21 1400    doxycycline (VIBRAMYCIN) 100 MG capsule  2 times daily        03/17/21 1400              Domenic Moras, PA-C 03/17/21 2205    Charlesetta Shanks, MD 03/25/21 [phone]

## 2021-05-03 ENCOUNTER — Institutional Professional Consult (permissible substitution): Payer: BC Managed Care – PPO | Admitting: Internal Medicine

## 2021-11-15 DIAGNOSIS — R5382 Chronic fatigue, unspecified: Secondary | ICD-10-CM | POA: Diagnosis not present

## 2021-11-15 DIAGNOSIS — R030 Elevated blood-pressure reading, without diagnosis of hypertension: Secondary | ICD-10-CM | POA: Diagnosis not present

## 2021-11-15 DIAGNOSIS — R109 Unspecified abdominal pain: Secondary | ICD-10-CM | POA: Diagnosis not present

## 2021-11-15 DIAGNOSIS — G43909 Migraine, unspecified, not intractable, without status migrainosus: Secondary | ICD-10-CM | POA: Diagnosis not present

## 2021-11-15 DIAGNOSIS — R7303 Prediabetes: Secondary | ICD-10-CM | POA: Diagnosis not present

## 2021-12-12 DIAGNOSIS — M5412 Radiculopathy, cervical region: Secondary | ICD-10-CM | POA: Diagnosis not present

## 2021-12-14 DIAGNOSIS — E669 Obesity, unspecified: Secondary | ICD-10-CM | POA: Diagnosis not present

## 2021-12-14 DIAGNOSIS — M4722 Other spondylosis with radiculopathy, cervical region: Secondary | ICD-10-CM | POA: Diagnosis not present

## 2021-12-14 DIAGNOSIS — Z981 Arthrodesis status: Secondary | ICD-10-CM | POA: Diagnosis not present

## 2021-12-21 DIAGNOSIS — F3289 Other specified depressive episodes: Secondary | ICD-10-CM | POA: Diagnosis not present

## 2021-12-21 DIAGNOSIS — F411 Generalized anxiety disorder: Secondary | ICD-10-CM | POA: Diagnosis not present

## 2021-12-21 DIAGNOSIS — G4709 Other insomnia: Secondary | ICD-10-CM | POA: Diagnosis not present

## 2021-12-21 DIAGNOSIS — Z658 Other specified problems related to psychosocial circumstances: Secondary | ICD-10-CM | POA: Diagnosis not present

## 2021-12-21 DIAGNOSIS — F410 Panic disorder [episodic paroxysmal anxiety] without agoraphobia: Secondary | ICD-10-CM | POA: Diagnosis not present

## 2021-12-30 DIAGNOSIS — M5432 Sciatica, left side: Secondary | ICD-10-CM | POA: Diagnosis not present

## 2022-01-16 DIAGNOSIS — M5417 Radiculopathy, lumbosacral region: Secondary | ICD-10-CM | POA: Diagnosis not present

## 2022-01-16 DIAGNOSIS — M4802 Spinal stenosis, cervical region: Secondary | ICD-10-CM | POA: Diagnosis not present

## 2022-01-16 DIAGNOSIS — M5412 Radiculopathy, cervical region: Secondary | ICD-10-CM | POA: Diagnosis not present

## 2022-01-16 DIAGNOSIS — M961 Postlaminectomy syndrome, not elsewhere classified: Secondary | ICD-10-CM | POA: Diagnosis not present

## 2022-02-10 DIAGNOSIS — K644 Residual hemorrhoidal skin tags: Secondary | ICD-10-CM | POA: Diagnosis not present

## 2022-02-10 DIAGNOSIS — R682 Dry mouth, unspecified: Secondary | ICD-10-CM | POA: Diagnosis not present

## 2022-02-10 DIAGNOSIS — R5383 Other fatigue: Secondary | ICD-10-CM | POA: Diagnosis not present

## 2022-02-10 DIAGNOSIS — H02824 Cysts of left upper eyelid: Secondary | ICD-10-CM | POA: Diagnosis not present

## 2022-02-10 DIAGNOSIS — E559 Vitamin D deficiency, unspecified: Secondary | ICD-10-CM | POA: Diagnosis not present

## 2022-02-10 DIAGNOSIS — I10 Essential (primary) hypertension: Secondary | ICD-10-CM | POA: Diagnosis not present

## 2022-02-14 ENCOUNTER — Other Ambulatory Visit: Payer: Self-pay | Admitting: Family Medicine

## 2022-02-14 DIAGNOSIS — T733XXA Exhaustion due to excessive exertion, initial encounter: Secondary | ICD-10-CM

## 2022-02-16 DIAGNOSIS — K529 Noninfective gastroenteritis and colitis, unspecified: Secondary | ICD-10-CM | POA: Diagnosis not present

## 2022-02-16 DIAGNOSIS — R1013 Epigastric pain: Secondary | ICD-10-CM | POA: Diagnosis not present

## 2022-02-16 DIAGNOSIS — K219 Gastro-esophageal reflux disease without esophagitis: Secondary | ICD-10-CM | POA: Diagnosis not present

## 2022-02-16 DIAGNOSIS — R140 Abdominal distension (gaseous): Secondary | ICD-10-CM | POA: Diagnosis not present

## 2022-03-03 DIAGNOSIS — M542 Cervicalgia: Secondary | ICD-10-CM | POA: Diagnosis not present

## 2022-03-08 ENCOUNTER — Other Ambulatory Visit: Payer: Self-pay | Admitting: Neurological Surgery

## 2022-03-08 DIAGNOSIS — M542 Cervicalgia: Secondary | ICD-10-CM

## 2022-03-17 DIAGNOSIS — E559 Vitamin D deficiency, unspecified: Secondary | ICD-10-CM | POA: Diagnosis not present

## 2022-03-17 DIAGNOSIS — Z23 Encounter for immunization: Secondary | ICD-10-CM | POA: Diagnosis not present

## 2022-03-17 DIAGNOSIS — I10 Essential (primary) hypertension: Secondary | ICD-10-CM | POA: Diagnosis not present

## 2022-03-19 DIAGNOSIS — G478 Other sleep disorders: Secondary | ICD-10-CM | POA: Diagnosis not present

## 2022-03-19 DIAGNOSIS — G4700 Insomnia, unspecified: Secondary | ICD-10-CM | POA: Diagnosis not present

## 2022-03-19 DIAGNOSIS — I10 Essential (primary) hypertension: Secondary | ICD-10-CM | POA: Diagnosis not present

## 2022-03-23 ENCOUNTER — Ambulatory Visit (INDEPENDENT_AMBULATORY_CARE_PROVIDER_SITE_OTHER): Payer: Self-pay

## 2022-03-23 DIAGNOSIS — T733XXA Exhaustion due to excessive exertion, initial encounter: Secondary | ICD-10-CM

## 2022-03-28 DIAGNOSIS — Z9049 Acquired absence of other specified parts of digestive tract: Secondary | ICD-10-CM | POA: Diagnosis not present

## 2022-03-28 DIAGNOSIS — K760 Fatty (change of) liver, not elsewhere classified: Secondary | ICD-10-CM | POA: Diagnosis not present

## 2022-03-28 DIAGNOSIS — N281 Cyst of kidney, acquired: Secondary | ICD-10-CM | POA: Diagnosis not present

## 2022-03-28 DIAGNOSIS — K7689 Other specified diseases of liver: Secondary | ICD-10-CM | POA: Diagnosis not present

## 2022-03-29 DIAGNOSIS — H0102B Squamous blepharitis left eye, upper and lower eyelids: Secondary | ICD-10-CM | POA: Diagnosis not present

## 2022-03-29 DIAGNOSIS — H0102A Squamous blepharitis right eye, upper and lower eyelids: Secondary | ICD-10-CM | POA: Diagnosis not present

## 2022-03-29 DIAGNOSIS — Z961 Presence of intraocular lens: Secondary | ICD-10-CM | POA: Diagnosis not present

## 2022-04-06 ENCOUNTER — Ambulatory Visit
Admission: RE | Admit: 2022-04-06 | Discharge: 2022-04-06 | Disposition: A | Discharge status: Home / Self Care | Payer: Medicare PPO | Source: Ambulatory Visit | Attending: Neurological Surgery | Admitting: Neurological Surgery

## 2022-04-06 DIAGNOSIS — M542 Cervicalgia: Secondary | ICD-10-CM

## 2022-04-06 DIAGNOSIS — M4312 Spondylolisthesis, cervical region: Secondary | ICD-10-CM | POA: Diagnosis not present

## 2022-04-06 DIAGNOSIS — M25512 Pain in left shoulder: Secondary | ICD-10-CM | POA: Diagnosis not present

## 2022-04-06 DIAGNOSIS — M4602 Spinal enthesopathy, cervical region: Secondary | ICD-10-CM | POA: Diagnosis not present

## 2022-04-06 DIAGNOSIS — M2578 Osteophyte, vertebrae: Secondary | ICD-10-CM | POA: Diagnosis not present

## 2022-04-10 DIAGNOSIS — F411 Generalized anxiety disorder: Secondary | ICD-10-CM | POA: Diagnosis not present

## 2022-04-10 DIAGNOSIS — G4709 Other insomnia: Secondary | ICD-10-CM | POA: Diagnosis not present

## 2022-04-10 DIAGNOSIS — F410 Panic disorder [episodic paroxysmal anxiety] without agoraphobia: Secondary | ICD-10-CM | POA: Diagnosis not present

## 2022-04-10 DIAGNOSIS — Z658 Other specified problems related to psychosocial circumstances: Secondary | ICD-10-CM | POA: Diagnosis not present

## 2022-04-10 DIAGNOSIS — F3289 Other specified depressive episodes: Secondary | ICD-10-CM | POA: Diagnosis not present

## 2022-04-11 DIAGNOSIS — Z6837 Body mass index (BMI) 37.0-37.9, adult: Secondary | ICD-10-CM | POA: Diagnosis not present

## 2022-04-11 DIAGNOSIS — M542 Cervicalgia: Secondary | ICD-10-CM | POA: Diagnosis not present

## 2022-04-14 DIAGNOSIS — F321 Major depressive disorder, single episode, moderate: Secondary | ICD-10-CM | POA: Diagnosis not present

## 2022-04-14 DIAGNOSIS — I700 Atherosclerosis of aorta: Secondary | ICD-10-CM | POA: Diagnosis not present

## 2022-04-14 DIAGNOSIS — J45909 Unspecified asthma, uncomplicated: Secondary | ICD-10-CM | POA: Diagnosis not present

## 2022-04-14 DIAGNOSIS — G9520 Unspecified cord compression: Secondary | ICD-10-CM | POA: Diagnosis not present

## 2022-04-14 DIAGNOSIS — Z6836 Body mass index (BMI) 36.0-36.9, adult: Secondary | ICD-10-CM | POA: Diagnosis not present

## 2022-04-14 DIAGNOSIS — I10 Essential (primary) hypertension: Secondary | ICD-10-CM | POA: Diagnosis not present

## 2022-04-25 DIAGNOSIS — M5412 Radiculopathy, cervical region: Secondary | ICD-10-CM | POA: Diagnosis not present

## 2022-04-25 DIAGNOSIS — M4802 Spinal stenosis, cervical region: Secondary | ICD-10-CM | POA: Diagnosis not present

## 2022-04-25 DIAGNOSIS — M961 Postlaminectomy syndrome, not elsewhere classified: Secondary | ICD-10-CM | POA: Diagnosis not present

## 2022-05-01 DIAGNOSIS — R197 Diarrhea, unspecified: Secondary | ICD-10-CM | POA: Diagnosis not present

## 2022-05-01 DIAGNOSIS — K648 Other hemorrhoids: Secondary | ICD-10-CM | POA: Diagnosis not present

## 2022-05-01 DIAGNOSIS — K5730 Diverticulosis of large intestine without perforation or abscess without bleeding: Secondary | ICD-10-CM | POA: Diagnosis not present

## 2022-05-01 DIAGNOSIS — K219 Gastro-esophageal reflux disease without esophagitis: Secondary | ICD-10-CM | POA: Diagnosis not present

## 2022-05-01 DIAGNOSIS — K3189 Other diseases of stomach and duodenum: Secondary | ICD-10-CM | POA: Diagnosis not present

## 2022-05-01 DIAGNOSIS — K2950 Unspecified chronic gastritis without bleeding: Secondary | ICD-10-CM | POA: Diagnosis not present

## 2022-05-05 DIAGNOSIS — I10 Essential (primary) hypertension: Secondary | ICD-10-CM | POA: Diagnosis not present

## 2022-05-29 DIAGNOSIS — K224 Dyskinesia of esophagus: Secondary | ICD-10-CM | POA: Diagnosis not present

## 2022-05-29 DIAGNOSIS — K7689 Other specified diseases of liver: Secondary | ICD-10-CM | POA: Diagnosis not present

## 2022-05-29 DIAGNOSIS — K529 Noninfective gastroenteritis and colitis, unspecified: Secondary | ICD-10-CM | POA: Diagnosis not present

## 2022-05-29 DIAGNOSIS — K5909 Other constipation: Secondary | ICD-10-CM | POA: Diagnosis not present

## 2022-05-29 DIAGNOSIS — R140 Abdominal distension (gaseous): Secondary | ICD-10-CM | POA: Diagnosis not present

## 2022-05-29 DIAGNOSIS — R1013 Epigastric pain: Secondary | ICD-10-CM | POA: Diagnosis not present

## 2022-05-29 DIAGNOSIS — K3189 Other diseases of stomach and duodenum: Secondary | ICD-10-CM | POA: Diagnosis not present

## 2022-05-29 DIAGNOSIS — K219 Gastro-esophageal reflux disease without esophagitis: Secondary | ICD-10-CM | POA: Diagnosis not present

## 2022-06-08 DIAGNOSIS — K7689 Other specified diseases of liver: Secondary | ICD-10-CM | POA: Diagnosis not present

## 2022-06-08 DIAGNOSIS — K5750 Diverticulosis of both small and large intestine without perforation or abscess without bleeding: Secondary | ICD-10-CM | POA: Diagnosis not present

## 2022-06-08 DIAGNOSIS — N281 Cyst of kidney, acquired: Secondary | ICD-10-CM | POA: Diagnosis not present

## 2022-06-08 DIAGNOSIS — K8689 Other specified diseases of pancreas: Secondary | ICD-10-CM | POA: Diagnosis not present

## 2022-06-08 DIAGNOSIS — I728 Aneurysm of other specified arteries: Secondary | ICD-10-CM | POA: Diagnosis not present

## 2022-06-19 DIAGNOSIS — J45909 Unspecified asthma, uncomplicated: Secondary | ICD-10-CM | POA: Diagnosis not present

## 2022-06-19 DIAGNOSIS — R1319 Other dysphagia: Secondary | ICD-10-CM | POA: Diagnosis not present

## 2022-06-19 DIAGNOSIS — I10 Essential (primary) hypertension: Secondary | ICD-10-CM | POA: Diagnosis not present

## 2022-06-23 DIAGNOSIS — R1013 Epigastric pain: Secondary | ICD-10-CM | POA: Diagnosis not present

## 2022-06-23 DIAGNOSIS — K219 Gastro-esophageal reflux disease without esophagitis: Secondary | ICD-10-CM | POA: Diagnosis not present

## 2022-06-23 DIAGNOSIS — K5909 Other constipation: Secondary | ICD-10-CM | POA: Diagnosis not present

## 2022-06-23 DIAGNOSIS — K7689 Other specified diseases of liver: Secondary | ICD-10-CM | POA: Diagnosis not present

## 2022-06-23 DIAGNOSIS — K3189 Other diseases of stomach and duodenum: Secondary | ICD-10-CM | POA: Diagnosis not present

## 2022-06-30 DIAGNOSIS — Z79899 Other long term (current) drug therapy: Secondary | ICD-10-CM | POA: Diagnosis not present

## 2022-06-30 DIAGNOSIS — Z5181 Encounter for therapeutic drug level monitoring: Secondary | ICD-10-CM | POA: Diagnosis not present

## 2022-06-30 DIAGNOSIS — M4802 Spinal stenosis, cervical region: Secondary | ICD-10-CM | POA: Diagnosis not present

## 2022-06-30 DIAGNOSIS — M48062 Spinal stenosis, lumbar region with neurogenic claudication: Secondary | ICD-10-CM | POA: Diagnosis not present

## 2022-06-30 DIAGNOSIS — M961 Postlaminectomy syndrome, not elsewhere classified: Secondary | ICD-10-CM | POA: Diagnosis not present

## 2022-06-30 DIAGNOSIS — M5412 Radiculopathy, cervical region: Secondary | ICD-10-CM | POA: Diagnosis not present

## 2022-07-04 DIAGNOSIS — K2900 Acute gastritis without bleeding: Secondary | ICD-10-CM | POA: Diagnosis not present

## 2022-07-04 DIAGNOSIS — K219 Gastro-esophageal reflux disease without esophagitis: Secondary | ICD-10-CM | POA: Diagnosis not present

## 2022-07-04 DIAGNOSIS — Z888 Allergy status to other drugs, medicaments and biological substances status: Secondary | ICD-10-CM | POA: Diagnosis not present

## 2022-07-04 DIAGNOSIS — D131 Benign neoplasm of stomach: Secondary | ICD-10-CM | POA: Diagnosis not present

## 2022-07-04 DIAGNOSIS — K3189 Other diseases of stomach and duodenum: Secondary | ICD-10-CM | POA: Diagnosis not present

## 2022-07-04 DIAGNOSIS — R519 Headache, unspecified: Secondary | ICD-10-CM | POA: Diagnosis not present

## 2022-07-04 DIAGNOSIS — I10 Essential (primary) hypertension: Secondary | ICD-10-CM | POA: Diagnosis not present

## 2022-07-04 DIAGNOSIS — K2970 Gastritis, unspecified, without bleeding: Secondary | ICD-10-CM | POA: Diagnosis not present

## 2022-07-04 DIAGNOSIS — K2960 Other gastritis without bleeding: Secondary | ICD-10-CM | POA: Diagnosis not present

## 2022-07-04 DIAGNOSIS — E669 Obesity, unspecified: Secondary | ICD-10-CM | POA: Diagnosis not present

## 2022-07-04 DIAGNOSIS — K259 Gastric ulcer, unspecified as acute or chronic, without hemorrhage or perforation: Secondary | ICD-10-CM | POA: Diagnosis not present

## 2022-07-04 DIAGNOSIS — J45909 Unspecified asthma, uncomplicated: Secondary | ICD-10-CM | POA: Diagnosis not present

## 2022-07-04 DIAGNOSIS — Z882 Allergy status to sulfonamides status: Secondary | ICD-10-CM | POA: Diagnosis not present

## 2022-07-12 DIAGNOSIS — Z981 Arthrodesis status: Secondary | ICD-10-CM | POA: Diagnosis not present

## 2022-07-12 DIAGNOSIS — M5136 Other intervertebral disc degeneration, lumbar region: Secondary | ICD-10-CM | POA: Diagnosis not present

## 2022-07-12 DIAGNOSIS — M2578 Osteophyte, vertebrae: Secondary | ICD-10-CM | POA: Diagnosis not present

## 2022-07-12 DIAGNOSIS — M4316 Spondylolisthesis, lumbar region: Secondary | ICD-10-CM | POA: Diagnosis not present

## 2022-07-12 DIAGNOSIS — M47816 Spondylosis without myelopathy or radiculopathy, lumbar region: Secondary | ICD-10-CM | POA: Diagnosis not present

## 2022-07-13 DIAGNOSIS — F411 Generalized anxiety disorder: Secondary | ICD-10-CM | POA: Diagnosis not present

## 2022-07-13 DIAGNOSIS — F410 Panic disorder [episodic paroxysmal anxiety] without agoraphobia: Secondary | ICD-10-CM | POA: Diagnosis not present

## 2022-07-13 DIAGNOSIS — G4709 Other insomnia: Secondary | ICD-10-CM | POA: Diagnosis not present

## 2022-07-13 DIAGNOSIS — Z658 Other specified problems related to psychosocial circumstances: Secondary | ICD-10-CM | POA: Diagnosis not present

## 2022-07-13 DIAGNOSIS — F3289 Other specified depressive episodes: Secondary | ICD-10-CM | POA: Diagnosis not present

## 2022-07-24 DIAGNOSIS — I773 Arterial fibromuscular dysplasia: Secondary | ICD-10-CM | POA: Diagnosis not present

## 2022-07-24 DIAGNOSIS — G4486 Cervicogenic headache: Secondary | ICD-10-CM | POA: Diagnosis not present

## 2022-07-24 DIAGNOSIS — R29818 Other symptoms and signs involving the nervous system: Secondary | ICD-10-CM | POA: Diagnosis not present

## 2022-07-27 DIAGNOSIS — K7689 Other specified diseases of liver: Secondary | ICD-10-CM | POA: Diagnosis not present

## 2022-07-27 DIAGNOSIS — K581 Irritable bowel syndrome with constipation: Secondary | ICD-10-CM | POA: Diagnosis not present

## 2022-07-27 DIAGNOSIS — K5903 Drug induced constipation: Secondary | ICD-10-CM | POA: Diagnosis not present

## 2022-07-27 DIAGNOSIS — T402X5A Adverse effect of other opioids, initial encounter: Secondary | ICD-10-CM | POA: Diagnosis not present

## 2022-07-27 DIAGNOSIS — K3189 Other diseases of stomach and duodenum: Secondary | ICD-10-CM | POA: Diagnosis not present

## 2022-07-27 DIAGNOSIS — K219 Gastro-esophageal reflux disease without esophagitis: Secondary | ICD-10-CM | POA: Diagnosis not present

## 2022-07-27 DIAGNOSIS — K3184 Gastroparesis: Secondary | ICD-10-CM | POA: Diagnosis not present

## 2022-07-28 DIAGNOSIS — M4802 Spinal stenosis, cervical region: Secondary | ICD-10-CM | POA: Diagnosis not present

## 2022-07-28 DIAGNOSIS — M5417 Radiculopathy, lumbosacral region: Secondary | ICD-10-CM | POA: Diagnosis not present

## 2022-07-28 DIAGNOSIS — G894 Chronic pain syndrome: Secondary | ICD-10-CM | POA: Diagnosis not present

## 2022-07-28 DIAGNOSIS — M961 Postlaminectomy syndrome, not elsewhere classified: Secondary | ICD-10-CM | POA: Diagnosis not present

## 2022-07-28 DIAGNOSIS — M5412 Radiculopathy, cervical region: Secondary | ICD-10-CM | POA: Diagnosis not present

## 2022-08-03 ENCOUNTER — Encounter: Payer: Self-pay | Admitting: Cardiology

## 2022-08-03 ENCOUNTER — Ambulatory Visit: Payer: Medicare PPO | Admitting: Cardiology

## 2022-08-03 VITALS — BP 125/85 | HR 108 | Resp 18 | Ht 63.0 in | Wt 209.4 lb

## 2022-08-03 DIAGNOSIS — R072 Precordial pain: Secondary | ICD-10-CM

## 2022-08-03 DIAGNOSIS — R931 Abnormal findings on diagnostic imaging of heart and coronary circulation: Secondary | ICD-10-CM | POA: Diagnosis not present

## 2022-08-03 DIAGNOSIS — R0609 Other forms of dyspnea: Secondary | ICD-10-CM

## 2022-08-03 NOTE — Progress Notes (Signed)
Patient referred by Clayborn Heron, MD for dyspnea, chest pain  Subjective:   Miranda [name], female    DOB: [birth year], 72 y.o.   MRN: [id number]   Chief Complaint  Patient presents with   Dysphagia   New Patient (Initial Visit)     HPI  72 y.o. Caucasian female with hypertension, migraine, referred for elevated coronary calcium score.  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 Mild irregular undulating contour involving a short segment of the right cervical internal carotid artery is nonspecific, but can be seen with fibromuscular dysplasia. Similar fusiform enlargement of the right ICA just proximal to the skull base. 2. No other acute arterial abnormality in the head and neck. No significant (50% or greater) stenosis or occlusion. 3. No acute intracranial abnormality.   Given above findings, she has been concerned. Added to this, mildly elevated coronary calcium score added to her concerns. She has had some nonspecific exertional dyspnea and fatigue symptoms, as well as nonexertional retrosternal chest pains.   Past Medical History:  Diagnosis Date   Allergic rhinitis    Anxiety    Asthma    Cough    Depression    Diarrhea    GERD (gastroesophageal reflux disease)    Hyperlipemia    Hypertension    Migraine    Small vessel disease (HCC)    Urticaria    Vertigo      Past Surgical History:  Procedure Laterality Date   BRAVO PH STUDY N/A 06/05/2012   Procedure: BRAVO PH STUDY;  Surgeon: Shirley Friar, MD;  Location: [facility];  Service: Endoscopy;  Laterality: N/A;   CHOLECYSTECTOMY     ESOPHAGOGASTRODUODENOSCOPY N/A 06/05/2012   Procedure: ESOPHAGOGASTRODUODENOSCOPY (EGD);  Surgeon: Shirley Friar, MD;  Location: [facility];  Service: Endoscopy;  Laterality: N/A;   LUMBAR SPINE SURGERY     x4    RHINOPLASTY     SEPTOPLASTY     TOTAL ABDOMINAL  HYSTERECTOMY       Social History   Tobacco Use  Smoking Status Never   Passive exposure: Never  Smokeless Tobacco Never    Social History   Substance and Sexual Activity  Alcohol Use No     Family History  Problem Relation Age of Onset   Pulmonary embolism Brother    Colon cancer Neg Hx    Breast cancer Other        Maternal Cousin       Current Outpatient Medications:    acetaminophen (TYLENOL) 500 MG tablet, Take 1,000-1,500 mg by mouth every 6 (six) hours as needed. pain , Disp: , Rfl:    Azelastine HCl (ASTEPRO) 0.15 % SOLN, Place 1 spray into the nose daily., Disp: 30 mL, Rfl: prn   Diazepam (VALIUM PO), Take 5 mg by mouth daily. 7.5 TO 10 MG PO QD, Disp: , Rfl:    doxycycline (VIBRAMYCIN) 100 MG capsule, Take 1 capsule (100 mg total) by mouth 2 (two) times daily. One po bid x 7 days, Disp: 14 capsule, Rfl: 0   HYDROcodone-guaiFENesin 2.5-200 MG/5ML SOLN, Take PO q6hrs as needed for cough, Disp: 118 mL, Rfl: 0   ipratropium (ATROVENT HFA) 17 MCG/ACT inhaler, 2 puffs four times a day as needed rescue inhaler , Disp: , Rfl:    levalbuterol (XOPENEX HFA) 45 MCG/ACT inhaler, Inhale 2 puffs into the lungs 4 (four) times daily as needed for wheezing or  shortness of breath., Disp: 1 Inhaler, Rfl: prn   levalbuterol (XOPENEX) 0.63 MG/3ML nebulizer solution, Take 1 ampule by nebulization 3 (three) times daily as needed.  , Disp: , Rfl:    loratadine (ALAVERT) 10 MG tablet, Take 10 mg by mouth daily as needed. , Disp: , Rfl:    mirtazapine (REMERON) 15 MG tablet, , Disp: , Rfl:    QUEtiapine (SEROQUEL) 25 MG tablet, , Disp: , Rfl:    zolpidem (AMBIEN) 5 MG tablet, 5 mg at bedtime as needed. , Disp: , Rfl:    Cardiovascular and other pertinent studies:  Reviewed external labs and tests, independently interpreted  EKG 08/03/2022: Sinus rhythm 94 bpm  Low voltage in precordial leads Nonspecific T-abnormality  CT abdomen pelvis w/contrast 06/08/2022: No acute findings  in the abdomen/pelvis. Incidental findings detailed above. Small splenic hilum subcentimeter aneurysm, consider CT angiogram follow-up in 6 months.   CT cardaic scoring 03/24/2022: LM: 0 LAD: 0 Lcx: 0 RCA: 4.79 Total 4.79 (35th percentile)  CTA head/neck 12/24/2020: 1.  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 Mild irregular undulating contour involving a short segment of the right cervical internal carotid artery is nonspecific, but can be seen with fibromuscular dysplasia. Similar fusiform enlargement of the right ICA just proximal to the skull base.  2.  No other acute arterial abnormality in the head and neck.  No significant (50% or greater) stenosis or occlusion.  3.  No acute intracranial abnormality.    Recent labs: 03/17/2022: Glucose 117, BUN/Cr 15/1.02. EGFR 58. Na/K 143/4.1. Rest of the CMP normal  11/15/2021: H/H 14/43. MCV 91. Platelets 347 HbA1C 6.1% TSH 0.9 normal   Review of Systems  Constitutional: Positive for malaise/fatigue.  Cardiovascular:  Positive for dyspnea on exertion. Negative for chest pain, leg swelling, palpitations and syncope.         Vitals:   08/03/22 1315  BP: 125/85  Pulse: (!) 108  Resp: 18  SpO2: 94%     Body mass index is 37.09 kg/m. Filed Weights   08/03/22 1315  Weight: 209 lb 6.4 oz (95 kg)     Objective:   Physical Exam Vitals and nursing note reviewed.  Constitutional:      General: She is not in acute distress. Neck:     Vascular: No JVD.  Cardiovascular:     Rate and Rhythm: Normal rate and regular rhythm.     Heart sounds: Normal heart sounds. No murmur heard. Pulmonary:     Effort: Pulmonary effort is normal.     Breath sounds: Normal breath sounds. No wheezing or rales.  Musculoskeletal:     Right lower leg: No edema.     Left lower leg: No edema.           Visit diagnoses:   ICD-10-CM   1. Elevated coronary  artery calcium score  R93.1 Lipid panel    PCV ECHOCARDIOGRAM COMPLETE    PCV MYOCARDIAL PERFUSION WO LEXISCAN    2. Precordial pain  R07.2 EKG 12-Lead    PCV ECHOCARDIOGRAM COMPLETE    PCV MYOCARDIAL PERFUSION WO LEXISCAN    3. Exertional dyspnea  R06.09 PCV ECHOCARDIOGRAM COMPLETE    PCV MYOCARDIAL PERFUSION WO LEXISCAN       Orders Placed This Encounter  Procedures   Lipid panel   PCV MYOCARDIAL PERFUSION WO LEXISCAN   EKG 12-Lead   PCV ECHOCARDIOGRAM COMPLETE     Medication changes this visit: Medications Discontinued  During This Encounter  Medication Reason   Azelastine HCl (ASTEPRO) 0.15 % SOLN    doxycycline (VIBRAMYCIN) 100 MG capsule    HYDROcodone-guaiFENesin 2.5-200 MG/5ML SOLN    ipratropium (ATROVENT HFA) 17 MCG/ACT inhaler    mirtazapine (REMERON) 15 MG tablet    QUEtiapine (SEROQUEL) 25 MG tablet    zolpidem (AMBIEN) 5 MG tablet      Assessment & Recommendations:    72 y.o. Caucasian female with hypertension, migraine, referred for elevated coronary calcium score.  Calcium score mildly elevated at 5. Nonspecific exertional dyspnea symptoms. Check echocardiogram, Lexiscan nuclear stress test, lipid panel.  Further recommendations after above testing.     Thank you for referring the patient to [place]. Please feel free to contact with any questions.   Elder Negus, MD Pager: [phone] Office: [phone]

## 2022-08-04 ENCOUNTER — Encounter: Payer: Self-pay | Admitting: Cardiology

## 2022-08-04 DIAGNOSIS — R072 Precordial pain: Secondary | ICD-10-CM | POA: Insufficient documentation

## 2022-08-04 DIAGNOSIS — R0609 Other forms of dyspnea: Secondary | ICD-10-CM | POA: Insufficient documentation

## 2022-08-10 ENCOUNTER — Other Ambulatory Visit: Payer: Medicare PPO

## 2022-08-22 DIAGNOSIS — Z1231 Encounter for screening mammogram for malignant neoplasm of breast: Secondary | ICD-10-CM | POA: Diagnosis not present

## 2022-08-30 DIAGNOSIS — R931 Abnormal findings on diagnostic imaging of heart and coronary circulation: Secondary | ICD-10-CM | POA: Diagnosis not present

## 2022-08-31 LAB — LIPID PANEL
Chol/HDL Ratio: 3.8 ratio (ref 0.0–4.4)
Cholesterol, Total: 265 mg/dL — ABNORMAL HIGH (ref 100–199)
HDL: 69 mg/dL (ref >39)
LDL Chol Calc (NIH): 168 mg/dL — ABNORMAL HIGH (ref 0–99)
Triglycerides: 156 mg/dL — ABNORMAL HIGH (ref 0–149)
VLDL Cholesterol Cal: 28 mg/dL (ref 5–40)

## 2022-08-31 NOTE — Progress Notes (Signed)
Sure.  Thanks MJP  

## 2022-08-31 NOTE — Progress Notes (Signed)
Cholesterol is elevated. In addition to heary healthy diet and exercise, I do recommend Crestor 20 mg daily. If patient agreeable, pelase send 20 mg 30 pillsX3 refills. Will discuss how she is toelrating at next office visit in July.  Thanks MJP

## 2022-08-31 NOTE — Progress Notes (Signed)
Pt states she has tired many statins and would prefer not to. She has tried Simvastatin/pravastatin.she will discuss w/ you at appt after tests

## 2022-09-01 ENCOUNTER — Other Ambulatory Visit: Payer: Medicare PPO

## 2022-09-11 ENCOUNTER — Ambulatory Visit: Payer: Medicare PPO | Admitting: Cardiology

## 2022-09-11 ENCOUNTER — Ambulatory Visit: Payer: Medicare PPO

## 2022-09-11 DIAGNOSIS — R072 Precordial pain: Secondary | ICD-10-CM

## 2022-09-11 DIAGNOSIS — R931 Abnormal findings on diagnostic imaging of heart and coronary circulation: Secondary | ICD-10-CM

## 2022-09-11 DIAGNOSIS — R0609 Other forms of dyspnea: Secondary | ICD-10-CM

## 2022-09-13 ENCOUNTER — Ambulatory Visit: Payer: Medicare PPO

## 2022-09-13 DIAGNOSIS — R931 Abnormal findings on diagnostic imaging of heart and coronary circulation: Secondary | ICD-10-CM | POA: Diagnosis not present

## 2022-09-13 DIAGNOSIS — R0609 Other forms of dyspnea: Secondary | ICD-10-CM | POA: Diagnosis not present

## 2022-09-13 DIAGNOSIS — R072 Precordial pain: Secondary | ICD-10-CM

## 2022-09-21 ENCOUNTER — Ambulatory Visit: Payer: Medicare PPO | Admitting: Cardiology

## 2022-09-21 ENCOUNTER — Encounter: Payer: Self-pay | Admitting: Cardiology

## 2022-09-21 ENCOUNTER — Other Ambulatory Visit: Payer: Self-pay

## 2022-09-21 VITALS — BP 125/80 | HR 104 | Resp 16 | Ht 63.0 in | Wt 213.8 lb

## 2022-09-21 DIAGNOSIS — R931 Abnormal findings on diagnostic imaging of heart and coronary circulation: Secondary | ICD-10-CM

## 2022-09-21 DIAGNOSIS — I10 Essential (primary) hypertension: Secondary | ICD-10-CM

## 2022-09-21 DIAGNOSIS — G720 Drug-induced myopathy: Secondary | ICD-10-CM

## 2022-09-21 DIAGNOSIS — E782 Mixed hyperlipidemia: Secondary | ICD-10-CM | POA: Insufficient documentation

## 2022-09-21 DIAGNOSIS — T466X5A Adverse effect of antihyperlipidemic and antiarteriosclerotic drugs, initial encounter: Secondary | ICD-10-CM | POA: Diagnosis not present

## 2022-09-21 MED ORDER — BEMPEDOIC ACID 180 MG PO TABS: 180.0000 mg | ORAL_TABLET | Freq: Every day | ORAL | 3 refills | Status: DC

## 2022-09-21 MED ORDER — BEMPEDOIC ACID 180 MG PO TABS: 180.0000 mg | ORAL_TABLET | Freq: Every day | ORAL | 3 refills | Status: AC

## 2022-09-21 NOTE — Progress Notes (Signed)
Patient referred by Henrine Screws, MD for dyspnea, chest pain  Subjective:   Miranda [name], female    DOB: [birth year], 72 y.o.   MRN: [id number]   Chief Complaint  Patient presents with   Elevated coronary artery calcium score     HPI  72 y.o. Caucasian female with hypertension, migraine, referred for elevated coronary calcium score.  Discussed lipid panel results with the patient. Patient has tried multiple statins-including lipitor and Crestor, as well as Zetia. She has had multiple side effects including severe myalgias, muscle aches, GI intolerance. Calcium score is mildly elevated at 5.    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 Mild irregular undulating contour involving a short segment of the right cervical internal carotid artery is nonspecific, but can be seen with fibromuscular dysplasia. Similar fusiform enlargement of the right ICA just proximal to the skull base. 2. No other acute arterial abnormality in the head and neck. No significant (50% or greater) stenosis or occlusion. 3. No acute intracranial abnormality.   Given above findings, she has been concerned. Added to this, mildly elevated coronary calcium score added to her concerns. She has had some nonspecific exertional dyspnea and fatigue symptoms, as well as nonexertional retrosternal chest pains.   Current Outpatient Medications:    acetaminophen (TYLENOL) 500 MG tablet, Take 1,000-1,500 mg by mouth every 6 (six) hours as needed. pain , Disp: , Rfl:    Cholecalciferol (VITAMIN D3) 1.25 MG (50000 UT) CAPS, Take 1 capsule by mouth 2 (two) times a week., Disp: , Rfl:    cyanocobalamin ([employer] VITAMIN B12) 1000 MCG tablet, Take 500 mcg by mouth daily., Disp: , Rfl:    Diazepam (VALIUM PO), Take 5 mg by mouth daily. 7.5 TO 10 MG PO QD, Disp: , Rfl:    esomeprazole (NEXIUM) 20 MG  capsule, Take 20 mg by mouth daily at 12 noon., Disp: , Rfl:    levalbuterol (XOPENEX HFA) 45 MCG/ACT inhaler, Inhale 2 puffs into the lungs 4 (four) times daily as needed for wheezing or shortness of breath., Disp: 1 Inhaler, Rfl: prn   levalbuterol (XOPENEX) 0.63 MG/3ML nebulizer solution, Take 1 ampule by nebulization 3 (three) times daily as needed.  , Disp: , Rfl:    loratadine (ALAVERT) 10 MG tablet, Take 10 mg by mouth daily as needed. , Disp: , Rfl:    predniSONE (DELTASONE) 10 MG tablet, Take 10 mg by mouth. As needed, Disp: , Rfl:    predniSONE (DELTASONE) 20 MG tablet, Take 20 mg by mouth. As needed, Disp: , Rfl:    SUMAtriptan-naproxen (TREXIMET) 85-500 MG tablet, Take 1 tablet by mouth every 2 (two) hours as needed., Disp: , Rfl:    Vitamin D, Ergocalciferol, (DRISDOL) 1.25 MG (50000 UNIT) CAPS capsule, Take 50,000 Units by mouth every 7 (seven) days., Disp: , Rfl:    Cardiovascular and other pertinent studies:  Reviewed external labs and tests, independently interpreted  EKG 08/03/2022: Sinus rhythm 94 bpm  Low voltage in precordial leads Nonspecific T-abnormality  Echocardiogram 09/13/2022: Left ventricle cavity is normal in size. Normal left ventricular wall thickness. Normal global wall motion. Normal LV systolic function with EF 63%. Doppler evidence of grade I (impaired) diastolic dysfunction, normal LAP. No significant valvular abnormality. No evidence of pulmonary hypertension.  Exercise Tetrofosmin stress test 09/11/2022: Exercise nuclear stress test was performed using Bruce protocol. Patient exercised for 2 minutes and 22  seconds, reached 4.6 METS, and 93% of age predicted maximum heart rate. Exercise capacity was low. No chest pain reported. Heart rate and hemodynamic response were normal. Stress EKG revealed no ischemic changes. Normal myocardial perfusion. Stress LVEF 57%. Low risk study,    CT abdomen pelvis w/contrast 06/08/2022: No acute findings in the  abdomen/pelvis. Incidental findings detailed above. Small splenic hilum subcentimeter aneurysm, consider CT angiogram follow-up in 6 months.   CT cardaic scoring 03/24/2022: LM: 0 LAD: 0 Lcx: 0 RCA: 4.79 Total 4.79 (35th percentile)  CTA head/neck 12/24/2020: 1.  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 Mild irregular undulating contour involving a short segment of the right cervical internal carotid artery is nonspecific, but can be seen with fibromuscular dysplasia. Similar fusiform enlargement of the right ICA just proximal to the skull base.  2.  No other acute arterial abnormality in the head and neck.  No significant (50% or greater) stenosis or occlusion.  3.  No acute intracranial abnormality.    Recent labs: 08/30/2022: Chol 265, TG 156, HDL 69, LDL 168  03/17/2022: Glucose 117, BUN/Cr 15/1.02. EGFR 58. Na/K 143/4.1. Rest of the CMP normal  11/15/2021: H/H 14/43. MCV 91. Platelets 347 HbA1C 6.1% TSH 0.9 normal   Review of Systems  Constitutional: Positive for malaise/fatigue.  Cardiovascular:  Positive for dyspnea on exertion. Negative for chest pain, leg swelling, palpitations and syncope.  Musculoskeletal:  Positive for back pain.         Vitals:   09/21/22 1126  BP: 125/80  Pulse: (!) 104  Resp: 16  SpO2: 96%      Body mass index is 37.87 kg/m. Filed Weights   09/21/22 1126  Weight: 213 lb 12.8 oz (97 kg)      Objective:   Physical Exam Vitals and nursing note reviewed.  Constitutional:      General: She is not in acute distress. Neck:     Vascular: No JVD.  Cardiovascular:     Rate and Rhythm: Normal rate and regular rhythm.     Heart sounds: Normal heart sounds. No murmur heard. Pulmonary:     Effort: Pulmonary effort is normal.     Breath sounds: Normal breath sounds. No wheezing or rales.  Musculoskeletal:     Right lower leg: No edema.     Left  lower leg: No edema.           Visit diagnoses:   ICD-10-CM   1. Elevated coronary artery calcium score  R93.1 Lipid panel    Lipid panel    2. Primary hypertension  I10     3. Mixed hyperlipidemia  E78.2 Lipid panel    Lipid panel        Orders Placed This Encounter  Procedures   Lipid panel     Medication changes this visit: Medications Discontinued During This Encounter  Medication Reason   predniSONE (DELTASONE) 20 MG tablet    Cholecalciferol (VITAMIN D3) 1.25 MG (50000 UT) CAPS    loratadine (ALAVERT) 10 MG tablet       Assessment & Recommendations:    72 y.o. Caucasian female with hypertension, hyperlipidemia, migraine, referred for elevated coronary calcium score.  Calcium score 5 Chol 265, TG 156, HDL 69, LDL 168 Statin myalgias. Recommend Bempedoic acid 180 mg daily. Repeat lipid panel in 3 months   F/u in 3 months   Elder Negus, MD Pager: [phone] Office: [phone]

## 2022-12-13 DIAGNOSIS — G43719 Chronic migraine without aura, intractable, without status migrainosus: Secondary | ICD-10-CM | POA: Diagnosis not present

## 2022-12-13 DIAGNOSIS — I773 Arterial fibromuscular dysplasia: Secondary | ICD-10-CM | POA: Diagnosis not present

## 2022-12-21 DIAGNOSIS — Z5181 Encounter for therapeutic drug level monitoring: Secondary | ICD-10-CM | POA: Diagnosis not present

## 2022-12-21 DIAGNOSIS — M961 Postlaminectomy syndrome, not elsewhere classified: Secondary | ICD-10-CM | POA: Diagnosis not present

## 2022-12-21 DIAGNOSIS — Z79899 Other long term (current) drug therapy: Secondary | ICD-10-CM | POA: Diagnosis not present

## 2022-12-21 DIAGNOSIS — M47812 Spondylosis without myelopathy or radiculopathy, cervical region: Secondary | ICD-10-CM | POA: Diagnosis not present

## 2022-12-25 ENCOUNTER — Ambulatory Visit: Payer: Self-pay | Admitting: Cardiology

## 2023-01-02 ENCOUNTER — Telehealth: Payer: Self-pay | Admitting: Cardiology

## 2023-01-02 DIAGNOSIS — F411 Generalized anxiety disorder: Secondary | ICD-10-CM | POA: Diagnosis not present

## 2023-01-02 NOTE — Telephone Encounter (Signed)
Patient wants a provider switch from Dr. Rosemary Holms at [facility] to Dr. Cristal Deer at [facility] as that location is closer to her home.  Please confirm.

## 2023-01-02 NOTE — Telephone Encounter (Signed)
Okay with me. If taking Bempedoic acid, needs lipid panel before seeing Dr, Hughie Closs. If not taking Bempedoic acid, may need referral to lipid clinic.  Thanks MJP

## 2023-01-02 NOTE — Telephone Encounter (Signed)
Spoke with patient, she would prefer to wait and have repeat lab work done at her PCP and speak with them about her cholesterol medication options. Patient has not been taking her bempedoic and was going to speak with her PCP about options, doesn't want to be referred to the lipid clinic.  Dr Rosemary Holms approves, patient can be established with Dr Cristal Deer at [facility], better office location for patient.

## 2023-01-08 ENCOUNTER — Ambulatory Visit: Payer: Medicare PPO | Admitting: Cardiology

## 2023-01-19 DIAGNOSIS — M5412 Radiculopathy, cervical region: Secondary | ICD-10-CM | POA: Diagnosis not present

## 2023-01-23 DIAGNOSIS — Z23 Encounter for immunization: Secondary | ICD-10-CM | POA: Diagnosis not present

## 2023-01-23 DIAGNOSIS — E782 Mixed hyperlipidemia: Secondary | ICD-10-CM | POA: Diagnosis not present

## 2023-01-23 DIAGNOSIS — I700 Atherosclerosis of aorta: Secondary | ICD-10-CM | POA: Diagnosis not present

## 2023-01-23 DIAGNOSIS — Z0000 Encounter for general adult medical examination without abnormal findings: Secondary | ICD-10-CM | POA: Diagnosis not present

## 2023-01-23 DIAGNOSIS — E559 Vitamin D deficiency, unspecified: Secondary | ICD-10-CM | POA: Diagnosis not present

## 2023-01-23 DIAGNOSIS — F321 Major depressive disorder, single episode, moderate: Secondary | ICD-10-CM | POA: Diagnosis not present

## 2023-01-23 DIAGNOSIS — I2510 Atherosclerotic heart disease of native coronary artery without angina pectoris: Secondary | ICD-10-CM | POA: Diagnosis not present

## 2023-01-23 DIAGNOSIS — G43909 Migraine, unspecified, not intractable, without status migrainosus: Secondary | ICD-10-CM | POA: Diagnosis not present

## 2023-01-30 DIAGNOSIS — Z981 Arthrodesis status: Secondary | ICD-10-CM | POA: Diagnosis not present

## 2023-01-30 DIAGNOSIS — M4722 Other spondylosis with radiculopathy, cervical region: Secondary | ICD-10-CM | POA: Diagnosis not present

## 2023-01-30 DIAGNOSIS — G43719 Chronic migraine without aura, intractable, without status migrainosus: Secondary | ICD-10-CM | POA: Diagnosis not present

## 2023-03-13 DIAGNOSIS — F329 Major depressive disorder, single episode, unspecified: Secondary | ICD-10-CM | POA: Diagnosis not present

## 2023-03-13 DIAGNOSIS — E782 Mixed hyperlipidemia: Secondary | ICD-10-CM | POA: Diagnosis not present

## 2023-03-13 DIAGNOSIS — R739 Hyperglycemia, unspecified: Secondary | ICD-10-CM | POA: Diagnosis not present

## 2023-03-13 DIAGNOSIS — G43909 Migraine, unspecified, not intractable, without status migrainosus: Secondary | ICD-10-CM | POA: Diagnosis not present

## 2023-03-13 DIAGNOSIS — G894 Chronic pain syndrome: Secondary | ICD-10-CM | POA: Diagnosis not present

## 2023-03-13 DIAGNOSIS — M130 Polyarthritis, unspecified: Secondary | ICD-10-CM | POA: Diagnosis not present

## 2023-03-13 DIAGNOSIS — I10 Essential (primary) hypertension: Secondary | ICD-10-CM | POA: Diagnosis not present

## 2023-03-16 ENCOUNTER — Other Ambulatory Visit (HOSPITAL_COMMUNITY): Payer: Self-pay

## 2023-04-05 ENCOUNTER — Ambulatory Visit (HOSPITAL_BASED_OUTPATIENT_CLINIC_OR_DEPARTMENT_OTHER): Payer: Medicare PPO | Admitting: Cardiology

## 2023-04-19 DIAGNOSIS — F411 Generalized anxiety disorder: Secondary | ICD-10-CM | POA: Diagnosis not present

## 2023-05-07 ENCOUNTER — Encounter (HOSPITAL_BASED_OUTPATIENT_CLINIC_OR_DEPARTMENT_OTHER): Payer: Self-pay | Admitting: Family

## 2023-05-07 ENCOUNTER — Ambulatory Visit (HOSPITAL_BASED_OUTPATIENT_CLINIC_OR_DEPARTMENT_OTHER): Payer: Medicare PPO | Admitting: Family

## 2023-05-07 VITALS — BP 130/84 | HR 79 | Ht 63.0 in | Wt 216.0 lb

## 2023-05-07 DIAGNOSIS — I10 Essential (primary) hypertension: Secondary | ICD-10-CM | POA: Diagnosis not present

## 2023-05-07 DIAGNOSIS — E785 Hyperlipidemia, unspecified: Secondary | ICD-10-CM

## 2023-05-07 DIAGNOSIS — I25118 Atherosclerotic heart disease of native coronary artery with other forms of angina pectoris: Secondary | ICD-10-CM | POA: Diagnosis not present

## 2023-05-07 DIAGNOSIS — T466X5A Adverse effect of antihyperlipidemic and antiarteriosclerotic drugs, initial encounter: Secondary | ICD-10-CM

## 2023-05-07 DIAGNOSIS — G720 Drug-induced myopathy: Secondary | ICD-10-CM

## 2023-05-07 NOTE — Progress Notes (Signed)
 Cardiology Office Note:  .   Date:  05/07/2023  ID:  JESSECA MARSCH, DOB 1950/09/12, MRN [id number] PCP: Henrine Screws, MD  [facility] Providers Cardiologist:  None    History of Present Illness: .   Miranda [name] is a 73 y.o. female with hx of HTN, migraine, coronary artery disease, HLD, chronic pain related to DDD, depression (intolerant to many antidepressants).   CTA neck 12/2020 can consistent with bilateral distal cervical internal carotid pseudoaneurysms potentially related to prior trauma with no significant stenosis or occlusion. Coronary calcium score 03/2022 calcium score 4.79 placing her in the 35th percentile for age, race, sex matched controls and mild aortic atherosclerosis.  Exercise Myoview 09/2022 achieving 4.6 METS with no evidence of ischemia, low risk study.  Echo 09/13/2022 LVEF 63%, gr1dd, no significant valvular abnormality.  Previous patient of Dr. Abby Potash here to establish with our team due to closer location. Notes her blood pressure is often high when she sees her pain management doctor. BP at home most often in the 140s. Encouraged to bring her BP cuff to her next office visit to assess accuracy. Notes one episode of chest pain across her chest that radiated to her back as well as into her jaw that woke her from sleep describes as "big huge charley horse".  She was unsure if it was related to her back (DDD) or her heart. Reports she tried Nexletol but she felt sick to her stomach. Also notes intolerance to multiple blood pressure medications with feeling nauseous, "sick all over". Prior GI workup unremarkable for cause of nausea. Reports she has been told she has fibromuscular dysplasia. She has had 3 prior sleep studies with no sleep apnea but does have insomnia.   03/16/2023 total cholesterol 242, HDL 52, triglycerides 242, LDL 146.  Prior antihypertensives: Amlodipine (stomach upset) Lisinopril (cough, congestion) Losartan (did not tolerate, unknown  reaction) Clonidine (stomach upset) Atenolol - did not help with migraines  Prior lipid lowering agents Rosuvastatin - severe myalgias Atorvastatin - severe myalgias Zetia - tolerated ubt ineffective Nexletolol - stomach upset  ROS: Please see the history of present illness.    All other systems reviewed and are negative.   Studies Reviewed: Marland Kitchen   EKG Interpretation Date/Time:  Monday May 07 2023 14:25:27 EST Ventricular Rate:  79 PR Interval:  198 QRS Duration:  66 QT Interval:  370 QTC Calculation: 424 R Axis:   44  Text Interpretation: Normal sinus rhythm Low voltage QRS  No acute ST/T wave changes Confirmed by Gillian Shields ([id number]) on 05/07/2023 2:30:57 PM    CTA neck 12/2020  1.  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 Mild irregular undulating contour involving a short segment of the right cervical internal carotid artery is nonspecific, but can be seen with fibromuscular dysplasia. Similar fusiform enlargement of the right ICA just proximal to the skull base.  2.  No other acute arterial abnormality in the head and neck.  No significant (50% or greater) stenosis or occlusion.  3.  No acute intracranial abnormality.  Risk Assessment/Calculations:             Physical Exam:   VS:  BP 130/84   Pulse 79   Ht 5\' 3"  (1.6 m)   Wt 216 lb (98 kg)   SpO2 98%   BMI 38.26 kg/m    Wt Readings from Last 3 Encounters:  05/07/23 216 lb (98 kg)  09/21/22 213 lb  12.8 oz (97 kg)  08/03/22 209 lb 6.4 oz (95 kg)    GEN: Well nourished, well developed in no acute distress NECK: No JVD; No carotid bruits CARDIAC: RRR, no murmurs, rubs, gallops RESPIRATORY:  Clear to auscultation without rales, wheezing or rhonchi  ABDOMEN: Soft, non-tender, non-distended EXTREMITIES:  No edema; No deformity   ASSESSMENT AND PLAN: .    CAD - Stable with no anginal symptoms. Recent episode of chest  discomfort atypical for angina and more consistent with radiculopathy from her back issues. No indication for ischemic evaluation.  Recommend aiming for 150 minutes of moderate intensity activity per week and following a heart healthy diet.  Lipid management as below.   HLD, LDL less than 70- Intolerant to statin (Atorvastatin, Rosuvastatin) and Nexletolol. Previously tolerated Zetia but did not significantly lower cholesterol. Discussed repeat trial of Zetia versus PCSK9i vs Leqvio. She wishes to consider and discuss again at follow up.   HTN- BP minimally elevated today at 130/84 but most often at home 140s. She will bring BP cuff to next OV to ensure accuracy. Multiple prior intolerances, as above. Anticipate chronic pain contributory to labile BP. Not presently on BP lowering agent.  If BP lowering agent needed, consider low dose Candesartan 4-8mg  or Spironolactone 12.5mg  daily.       Dispo: follow up in 3 mos  Signed, Alver Sorrow, NP

## 2023-05-07 NOTE — Patient Instructions (Signed)
 Medication Instructions:  Your physician recommends that you continue on your current medications as directed. Please refer to the Current Medication list given to you today.  Follow-Up: At [facility], you and your health needs are our priority.  As part of our continuing mission to provide you with exceptional heart care, we have created designated Provider Care Teams.  These Care Teams include your primary Cardiologist (physician) and Advanced Practice Providers (APPs -  Physician Assistants and Nurse Practitioners) who all work together to provide you with the care you need, when you need it.  We recommend signing up for the patient portal called "MyChart".  Sign up information is provided on this After Visit Summary.  MyChart is used to connect with patients for Virtual Visits (Telemedicine).  Patients are able to view lab/test results, encounter notes, upcoming appointments, etc.  Non-urgent messages can be sent to your provider as well.   To learn more about what you can do with MyChart, go to [email or web address].    Your next appointment:   3 months with Dr. Cristal Deer or Gillian Shields, NP

## 2023-06-01 DIAGNOSIS — M545 Low back pain, unspecified: Secondary | ICD-10-CM | POA: Diagnosis not present

## 2023-06-06 DIAGNOSIS — M533 Sacrococcygeal disorders, not elsewhere classified: Secondary | ICD-10-CM | POA: Diagnosis not present

## 2023-06-06 DIAGNOSIS — G8929 Other chronic pain: Secondary | ICD-10-CM | POA: Diagnosis not present

## 2023-06-07 DIAGNOSIS — M533 Sacrococcygeal disorders, not elsewhere classified: Secondary | ICD-10-CM | POA: Diagnosis not present

## 2023-06-27 DIAGNOSIS — Z981 Arthrodesis status: Secondary | ICD-10-CM | POA: Diagnosis not present

## 2023-06-27 DIAGNOSIS — M961 Postlaminectomy syndrome, not elsewhere classified: Secondary | ICD-10-CM | POA: Diagnosis not present

## 2023-06-27 DIAGNOSIS — M5416 Radiculopathy, lumbar region: Secondary | ICD-10-CM | POA: Diagnosis not present

## 2023-06-27 DIAGNOSIS — M533 Sacrococcygeal disorders, not elsewhere classified: Secondary | ICD-10-CM | POA: Diagnosis not present

## 2023-06-27 DIAGNOSIS — M5412 Radiculopathy, cervical region: Secondary | ICD-10-CM | POA: Diagnosis not present

## 2023-06-27 DIAGNOSIS — M47812 Spondylosis without myelopathy or radiculopathy, cervical region: Secondary | ICD-10-CM | POA: Diagnosis not present

## 2023-07-18 ENCOUNTER — Encounter (HOSPITAL_BASED_OUTPATIENT_CLINIC_OR_DEPARTMENT_OTHER): Payer: Self-pay | Admitting: Cardiology

## 2023-07-18 ENCOUNTER — Ambulatory Visit (HOSPITAL_BASED_OUTPATIENT_CLINIC_OR_DEPARTMENT_OTHER): Payer: Medicare PPO | Admitting: Cardiology

## 2023-07-18 VITALS — BP 132/92 | HR 91 | Ht 63.0 in | Wt 217.3 lb

## 2023-07-18 DIAGNOSIS — I2510 Atherosclerotic heart disease of native coronary artery without angina pectoris: Secondary | ICD-10-CM

## 2023-07-18 DIAGNOSIS — E785 Hyperlipidemia, unspecified: Secondary | ICD-10-CM | POA: Diagnosis not present

## 2023-07-18 DIAGNOSIS — G720 Drug-induced myopathy: Secondary | ICD-10-CM

## 2023-07-18 DIAGNOSIS — T466X5D Adverse effect of antihyperlipidemic and antiarteriosclerotic drugs, subsequent encounter: Secondary | ICD-10-CM | POA: Diagnosis not present

## 2023-07-18 DIAGNOSIS — I10 Essential (primary) hypertension: Secondary | ICD-10-CM | POA: Diagnosis not present

## 2023-07-18 DIAGNOSIS — E782 Mixed hyperlipidemia: Secondary | ICD-10-CM | POA: Diagnosis not present

## 2023-07-18 DIAGNOSIS — T466X5A Adverse effect of antihyperlipidemic and antiarteriosclerotic drugs, initial encounter: Secondary | ICD-10-CM

## 2023-07-18 MED ORDER — SPIRONOLACTONE 25 MG PO TABS
12.5000 mg | ORAL_TABLET | Freq: Every day | ORAL | 11 refills | Status: AC
Start: 2023-07-18 — End: ?

## 2023-07-18 NOTE — Progress Notes (Signed)
 Cardiology Office Note:  .   Date:  07/18/2023  ID:  Miranda [name], DOB 12-14-1950, MRN [id number] PCP: Ruven Coy, MD  [facility] Providers Cardiologist:  Sheryle Donning, MD {  History of Present Illness: .   Miranda [name] is a 73 y.o. female with PMH hypertension, CAD, hyperlipidemia with statin intolerance, migraines, chronic pain 2/2 DDD, depression. She was previously followed by Dr. Filiberto Hug and established care with me on 07/18/23.  Pertinent CV history: Ca score 03/2022 4.79 (35 %ile). Exercise myoview  09/2022, 4.6 mets, no ischemia. Echo 09/2022 with EF 63%, G1DD, no significant valve disease. History of multiple medication intolerances:  Prior antihypertensives: Amlodipine (stomach upset) Lisinopril (cough, congestion) Losartan (did not tolerate, unknown reaction) Clonidine (stomach upset) Atenolol  - did not help with migraines   Prior lipid lowering agents Rosuvastatin - severe myalgias Atorvastatin - severe myalgias Zetia - tolerated ubt ineffective Nexletolol - stomach upset  Today: Concerned about labile blood pressure. Has been having more pain, saw her pain clinic yesterday. Feels like all the nerves in her body are on fire. Was checking BP at home daily but has stopped doing this more recently. Brought home BP cuff today to check this against our machine.  Having diffuse pain, but notes muscle spasm/ "quickening" periodically in her upper chest. Has felt this for years, but feels like it has gotten more frequent, worse when her acid reflux is worse, better with Tums/belching.   ROS: Denies shortness of breath at rest or with normal exertion. No PND, orthopnea, LE edema or unexpected weight gain. No syncope or palpitations. ROS otherwise negative except as noted.   Studies Reviewed: Aaron Aas    EKG:       Physical Exam:   VS:  BP (!) 132/92   Pulse 91   Ht 5\' 3"  (1.6 m)   Wt 217 lb 4.8 oz (98.6 kg)   SpO2 96%   BMI 38.49 kg/m    Wt  Readings from Last 3 Encounters:  07/18/23 217 lb 4.8 oz (98.6 kg)  05/07/23 216 lb (98 kg)  09/21/22 213 lb 12.8 oz (97 kg)    GEN: Well nourished, well developed in no acute distress HEENT: Normal, moist mucous membranes NECK: No JVD CARDIAC: regular rhythm, normal S1 and S2, no rubs or gallops. No murmur. VASCULAR: Radial and DP pulses 2+ bilaterally. No carotid bruits RESPIRATORY:  Clear to auscultation without rales, wheezing or rhonchi  ABDOMEN: Soft, non-tender, non-distended MUSCULOSKELETAL:  Ambulates independently SKIN: Warm and dry, no edema NEUROLOGIC:  Alert and oriented x 3. No focal neuro deficits noted. PSYCHIATRIC:  Normal affect    ASSESSMENT AND PLAN: .    Minimally elevated coronary calcification, consistent with nonobstructive CAD Hypercholesterolemia Statin intolerance -normal stress test as above -LDL goal <70, last 146 -has trialed statins, has not tolerated. Has discussed non-statin options. Concerned about injections as she has had issues with migraine shots that stayed in her system for a month after the injection (with side effects). Had muscle pain after about a month of ezetimibe. -denies any other testing beyond what is in our system (ie no prior cath, etc) -has chronic pain, reports that this is unchanged, no new/different chest discomfort  Labile blood pressure -elevated on recheck, esp diastolic. Discussed options. She is willing to try low dose spironolactone, will start 12.5 mg daily dose. Check bmet in 2-3 weeks. Check BP on home cuff-->was reading high, recommend replacing cuff  CV risk counseling and prevention -recommend heart healthy/Mediterranean  diet, with whole grains, fruits, vegetable, fish, lean meats, nuts, and olive oil. Limit salt. -recommend moderate walking, 3-5 times/week for 30-50 minutes each session. Aim for at least 150 minutes.week. Goal should be pace of 3 miles/hours, or walking 1.5 miles in 30 minutes -recommend avoidance  of tobacco products. Avoid excess alcohol.  Dispo: 2 mos with Neomi Banks  Signed, Sheryle Donning, MD   Sheryle Donning, MD, PhD, [facility] [place]  [facility]  [facility] [address], [address] [place], [place] [address] [phone]

## 2023-07-18 NOTE — Patient Instructions (Signed)
 Medication Instructions:  Your physician has recommended you make the following change in your medication:  START Spironolactone 12.5 mg  Lab Work: Your physician recommends that you return for lab work in 2 to 3 weeks: BMET  Follow-Up: Please follow up in 2 months with Neomi Banks, NP

## 2023-07-27 DIAGNOSIS — M47816 Spondylosis without myelopathy or radiculopathy, lumbar region: Secondary | ICD-10-CM | POA: Diagnosis not present

## 2023-07-27 DIAGNOSIS — M25552 Pain in left hip: Secondary | ICD-10-CM | POA: Diagnosis not present

## 2023-07-27 DIAGNOSIS — M4316 Spondylolisthesis, lumbar region: Secondary | ICD-10-CM | POA: Diagnosis not present

## 2023-07-27 DIAGNOSIS — M76892 Other specified enthesopathies of left lower limb, excluding foot: Secondary | ICD-10-CM | POA: Diagnosis not present

## 2023-07-27 DIAGNOSIS — M76891 Other specified enthesopathies of right lower limb, excluding foot: Secondary | ICD-10-CM | POA: Diagnosis not present

## 2023-07-27 DIAGNOSIS — M51369 Other intervertebral disc degeneration, lumbar region without mention of lumbar back pain or lower extremity pain: Secondary | ICD-10-CM | POA: Diagnosis not present

## 2023-07-27 DIAGNOSIS — M51361 Other intervertebral disc degeneration, lumbar region with lower extremity pain only: Secondary | ICD-10-CM | POA: Diagnosis not present

## 2023-07-27 DIAGNOSIS — Z981 Arthrodesis status: Secondary | ICD-10-CM | POA: Diagnosis not present

## 2023-07-27 DIAGNOSIS — Z1330 Encounter for screening examination for mental health and behavioral disorders, unspecified: Secondary | ICD-10-CM | POA: Diagnosis not present

## 2023-07-27 DIAGNOSIS — E669 Obesity, unspecified: Secondary | ICD-10-CM | POA: Diagnosis not present

## 2023-07-27 DIAGNOSIS — M25551 Pain in right hip: Secondary | ICD-10-CM | POA: Diagnosis not present

## 2023-07-27 DIAGNOSIS — M5416 Radiculopathy, lumbar region: Secondary | ICD-10-CM | POA: Diagnosis not present

## 2023-08-02 ENCOUNTER — Telehealth: Payer: Self-pay | Admitting: Cardiology

## 2023-08-02 DIAGNOSIS — G4709 Other insomnia: Secondary | ICD-10-CM | POA: Diagnosis not present

## 2023-08-02 DIAGNOSIS — F410 Panic disorder [episodic paroxysmal anxiety] without agoraphobia: Secondary | ICD-10-CM | POA: Diagnosis not present

## 2023-08-02 DIAGNOSIS — F3289 Other specified depressive episodes: Secondary | ICD-10-CM | POA: Diagnosis not present

## 2023-08-02 DIAGNOSIS — Z1331 Encounter for screening for depression: Secondary | ICD-10-CM | POA: Diagnosis not present

## 2023-08-02 DIAGNOSIS — Z658 Other specified problems related to psychosocial circumstances: Secondary | ICD-10-CM | POA: Diagnosis not present

## 2023-08-02 DIAGNOSIS — F411 Generalized anxiety disorder: Secondary | ICD-10-CM | POA: Diagnosis not present

## 2023-08-02 DIAGNOSIS — Z1330 Encounter for screening examination for mental health and behavioral disorders, unspecified: Secondary | ICD-10-CM | POA: Diagnosis not present

## 2023-08-02 NOTE — Telephone Encounter (Signed)
 Called and spoke to patient. Patient verified with 2 identifiers (DOB and Last Name)  Patient states:  - Pt took first dose of Spiro on Monday (5/19).  - Pt states she has hx of bronchial asthma which causes her to have issues breathing.  - A few hours after taking Spiro, pt noticed her throat/esophagus felt swollen and was having issues swallowing. 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  - Pt noticed increased phlegm in sputum and food as she coughed.  - Pt took Claritin at 3 am to help; pt states she took this several times.Shelah Derry to [place] for breakfast and felt her food slowly go down her throat.. pt took more Claritin then later that day, pt felt fine.  - Pt states she checked with pharmacist of any additional dye that could cause allergic reaction the day she picked up med. Pharmacist denied of any possible allergen.  - BP was 143/94 at OV with neurosurgeon on 5/16  - Pt has not taken medication since 5/19.  Will route to MD for any further recommendations.  Patient verbalized understanding.

## 2023-08-02 NOTE — Telephone Encounter (Signed)
 Pt c/o medication issue:  1. Name of Medication: Spironolactone  12.5 mg  2. How are you currently taking this medication (dosage and times per day)? Take 0.5 tablets (12.5 mg total) by mouth daily.   3. Are you having a reaction (difficulty breathing--STAT)? No  4. What is your medication issue? Patient is calling to inform us  that she is no longer taking this medication. Patient stated that she took the medication and noticed her esophagus was swollen. Patient stated she had difficulty swallowing and food/mucus would kept coming up. Please advise.

## 2023-08-03 NOTE — Telephone Encounter (Signed)
 Called and spoke to pt. Discussed APP's recommendation; pt verbalized understanding. Pt states she will re-trial Spiro.

## 2023-08-03 NOTE — Telephone Encounter (Signed)
 Atypical reaction to Spironolactone . More likely related to shrimp and would avoid eating if she has an allergy. If she is willing to re-trial Spironolactone  that would be my recommendation. Otherwise can discuss at her follow up in July.   Shaelin Lalley S Mitesh Rosendahl, NP

## 2023-08-21 DIAGNOSIS — Z981 Arthrodesis status: Secondary | ICD-10-CM | POA: Diagnosis not present

## 2023-08-21 DIAGNOSIS — Z9889 Other specified postprocedural states: Secondary | ICD-10-CM | POA: Diagnosis not present

## 2023-08-21 DIAGNOSIS — M48061 Spinal stenosis, lumbar region without neurogenic claudication: Secondary | ICD-10-CM | POA: Diagnosis not present

## 2023-08-21 DIAGNOSIS — M5416 Radiculopathy, lumbar region: Secondary | ICD-10-CM | POA: Diagnosis not present

## 2023-08-28 DIAGNOSIS — Z1231 Encounter for screening mammogram for malignant neoplasm of breast: Secondary | ICD-10-CM | POA: Diagnosis not present

## 2023-08-31 DIAGNOSIS — Z6837 Body mass index (BMI) 37.0-37.9, adult: Secondary | ICD-10-CM | POA: Diagnosis not present

## 2023-08-31 DIAGNOSIS — E559 Vitamin D deficiency, unspecified: Secondary | ICD-10-CM | POA: Diagnosis not present

## 2023-08-31 DIAGNOSIS — R7303 Prediabetes: Secondary | ICD-10-CM | POA: Diagnosis not present

## 2023-08-31 DIAGNOSIS — F419 Anxiety disorder, unspecified: Secondary | ICD-10-CM | POA: Diagnosis not present

## 2023-08-31 DIAGNOSIS — R300 Dysuria: Secondary | ICD-10-CM | POA: Diagnosis not present

## 2023-08-31 DIAGNOSIS — G720 Drug-induced myopathy: Secondary | ICD-10-CM | POA: Diagnosis not present

## 2023-08-31 DIAGNOSIS — I2510 Atherosclerotic heart disease of native coronary artery without angina pectoris: Secondary | ICD-10-CM | POA: Diagnosis not present

## 2023-08-31 DIAGNOSIS — I10 Essential (primary) hypertension: Secondary | ICD-10-CM | POA: Diagnosis not present

## 2023-08-31 DIAGNOSIS — E782 Mixed hyperlipidemia: Secondary | ICD-10-CM | POA: Diagnosis not present

## 2023-09-24 ENCOUNTER — Ambulatory Visit (HOSPITAL_BASED_OUTPATIENT_CLINIC_OR_DEPARTMENT_OTHER): Admitting: Family

## 2023-10-05 DIAGNOSIS — G894 Chronic pain syndrome: Secondary | ICD-10-CM | POA: Diagnosis not present

## 2023-10-05 DIAGNOSIS — Z6838 Body mass index (BMI) 38.0-38.9, adult: Secondary | ICD-10-CM | POA: Diagnosis not present

## 2023-10-05 DIAGNOSIS — G43909 Migraine, unspecified, not intractable, without status migrainosus: Secondary | ICD-10-CM | POA: Diagnosis not present

## 2023-10-15 DIAGNOSIS — M47812 Spondylosis without myelopathy or radiculopathy, cervical region: Secondary | ICD-10-CM | POA: Diagnosis not present

## 2023-10-15 DIAGNOSIS — M5416 Radiculopathy, lumbar region: Secondary | ICD-10-CM | POA: Diagnosis not present

## 2023-10-15 DIAGNOSIS — Z5181 Encounter for therapeutic drug level monitoring: Secondary | ICD-10-CM | POA: Diagnosis not present

## 2023-10-15 DIAGNOSIS — E669 Obesity, unspecified: Secondary | ICD-10-CM | POA: Diagnosis not present

## 2023-10-15 DIAGNOSIS — M961 Postlaminectomy syndrome, not elsewhere classified: Secondary | ICD-10-CM | POA: Diagnosis not present

## 2023-10-15 DIAGNOSIS — G894 Chronic pain syndrome: Secondary | ICD-10-CM | POA: Diagnosis not present

## 2023-10-15 DIAGNOSIS — Z79899 Other long term (current) drug therapy: Secondary | ICD-10-CM | POA: Diagnosis not present

## 2023-10-15 DIAGNOSIS — M533 Sacrococcygeal disorders, not elsewhere classified: Secondary | ICD-10-CM | POA: Diagnosis not present

## 2023-11-06 DIAGNOSIS — K219 Gastro-esophageal reflux disease without esophagitis: Secondary | ICD-10-CM | POA: Diagnosis not present

## 2023-11-06 DIAGNOSIS — K3189 Other diseases of stomach and duodenum: Secondary | ICD-10-CM | POA: Diagnosis not present

## 2023-11-06 DIAGNOSIS — T402X5A Adverse effect of other opioids, initial encounter: Secondary | ICD-10-CM | POA: Diagnosis not present

## 2023-11-06 DIAGNOSIS — I728 Aneurysm of other specified arteries: Secondary | ICD-10-CM | POA: Diagnosis not present

## 2023-11-06 DIAGNOSIS — K5903 Drug induced constipation: Secondary | ICD-10-CM | POA: Diagnosis not present

## 2023-11-06 DIAGNOSIS — K760 Fatty (change of) liver, not elsewhere classified: Secondary | ICD-10-CM | POA: Diagnosis not present

## 2023-11-07 DIAGNOSIS — M545 Low back pain, unspecified: Secondary | ICD-10-CM | POA: Diagnosis not present

## 2023-11-07 DIAGNOSIS — M961 Postlaminectomy syndrome, not elsewhere classified: Secondary | ICD-10-CM | POA: Diagnosis not present

## 2023-11-07 DIAGNOSIS — G8929 Other chronic pain: Secondary | ICD-10-CM | POA: Diagnosis not present

## 2023-11-15 DIAGNOSIS — M5416 Radiculopathy, lumbar region: Secondary | ICD-10-CM | POA: Diagnosis not present

## 2023-11-15 DIAGNOSIS — M4802 Spinal stenosis, cervical region: Secondary | ICD-10-CM | POA: Diagnosis not present

## 2023-11-15 DIAGNOSIS — M5417 Radiculopathy, lumbosacral region: Secondary | ICD-10-CM | POA: Diagnosis not present

## 2023-11-15 DIAGNOSIS — G894 Chronic pain syndrome: Secondary | ICD-10-CM | POA: Diagnosis not present

## 2023-11-15 DIAGNOSIS — M533 Sacrococcygeal disorders, not elsewhere classified: Secondary | ICD-10-CM | POA: Diagnosis not present

## 2023-11-19 DIAGNOSIS — G8929 Other chronic pain: Secondary | ICD-10-CM | POA: Diagnosis not present

## 2023-11-19 DIAGNOSIS — M961 Postlaminectomy syndrome, not elsewhere classified: Secondary | ICD-10-CM | POA: Diagnosis not present

## 2023-11-19 DIAGNOSIS — M545 Low back pain, unspecified: Secondary | ICD-10-CM | POA: Diagnosis not present

## 2023-11-21 DIAGNOSIS — K3189 Other diseases of stomach and duodenum: Secondary | ICD-10-CM | POA: Diagnosis not present

## 2023-11-21 DIAGNOSIS — T402X5A Adverse effect of other opioids, initial encounter: Secondary | ICD-10-CM | POA: Diagnosis not present

## 2023-11-21 DIAGNOSIS — I728 Aneurysm of other specified arteries: Secondary | ICD-10-CM | POA: Diagnosis not present

## 2023-11-21 DIAGNOSIS — K219 Gastro-esophageal reflux disease without esophagitis: Secondary | ICD-10-CM | POA: Diagnosis not present

## 2023-11-21 DIAGNOSIS — K5903 Drug induced constipation: Secondary | ICD-10-CM | POA: Diagnosis not present

## 2023-11-26 DIAGNOSIS — M5417 Radiculopathy, lumbosacral region: Secondary | ICD-10-CM | POA: Diagnosis not present

## 2023-11-29 DIAGNOSIS — F411 Generalized anxiety disorder: Secondary | ICD-10-CM | POA: Diagnosis not present

## 2023-11-29 DIAGNOSIS — F3289 Other specified depressive episodes: Secondary | ICD-10-CM | POA: Diagnosis not present

## 2023-11-29 DIAGNOSIS — G4709 Other insomnia: Secondary | ICD-10-CM | POA: Diagnosis not present

## 2023-11-29 DIAGNOSIS — F410 Panic disorder [episodic paroxysmal anxiety] without agoraphobia: Secondary | ICD-10-CM | POA: Diagnosis not present

## 2023-11-29 DIAGNOSIS — Z658 Other specified problems related to psychosocial circumstances: Secondary | ICD-10-CM | POA: Diagnosis not present

## 2023-12-18 ENCOUNTER — Encounter (HOSPITAL_BASED_OUTPATIENT_CLINIC_OR_DEPARTMENT_OTHER): Payer: Self-pay

## 2023-12-18 ENCOUNTER — Ambulatory Visit (HOSPITAL_BASED_OUTPATIENT_CLINIC_OR_DEPARTMENT_OTHER): Admitting: Family

## 2024-01-01 DIAGNOSIS — Z01818 Encounter for other preprocedural examination: Secondary | ICD-10-CM | POA: Diagnosis not present

## 2024-01-01 DIAGNOSIS — M5116 Intervertebral disc disorders with radiculopathy, lumbar region: Secondary | ICD-10-CM | POA: Diagnosis not present

## 2024-01-02 DIAGNOSIS — R9431 Abnormal electrocardiogram [ECG] [EKG]: Secondary | ICD-10-CM | POA: Diagnosis not present

## 2024-01-02 DIAGNOSIS — Z01810 Encounter for preprocedural cardiovascular examination: Secondary | ICD-10-CM | POA: Diagnosis not present

## 2024-01-03 DIAGNOSIS — I773 Arterial fibromuscular dysplasia: Secondary | ICD-10-CM | POA: Diagnosis not present

## 2024-01-03 DIAGNOSIS — M5116 Intervertebral disc disorders with radiculopathy, lumbar region: Secondary | ICD-10-CM | POA: Diagnosis not present

## 2024-01-03 DIAGNOSIS — G43719 Chronic migraine without aura, intractable, without status migrainosus: Secondary | ICD-10-CM | POA: Diagnosis not present

## 2024-01-08 DIAGNOSIS — Z01818 Encounter for other preprocedural examination: Secondary | ICD-10-CM | POA: Diagnosis not present

## 2024-01-08 DIAGNOSIS — M5116 Intervertebral disc disorders with radiculopathy, lumbar region: Secondary | ICD-10-CM | POA: Diagnosis not present

## 2024-01-09 DIAGNOSIS — K219 Gastro-esophageal reflux disease without esophagitis: Secondary | ICD-10-CM | POA: Diagnosis not present

## 2024-01-09 DIAGNOSIS — Q446 Cystic disease of liver: Secondary | ICD-10-CM | POA: Diagnosis not present

## 2024-01-09 DIAGNOSIS — K3189 Other diseases of stomach and duodenum: Secondary | ICD-10-CM | POA: Diagnosis not present

## 2024-01-09 DIAGNOSIS — K760 Fatty (change of) liver, not elsewhere classified: Secondary | ICD-10-CM | POA: Diagnosis not present

## 2024-01-09 DIAGNOSIS — T402X5A Adverse effect of other opioids, initial encounter: Secondary | ICD-10-CM | POA: Diagnosis not present

## 2024-01-09 DIAGNOSIS — K5903 Drug induced constipation: Secondary | ICD-10-CM | POA: Diagnosis not present

## 2024-01-21 DIAGNOSIS — Z79899 Other long term (current) drug therapy: Secondary | ICD-10-CM | POA: Diagnosis not present

## 2024-01-21 DIAGNOSIS — M5116 Intervertebral disc disorders with radiculopathy, lumbar region: Secondary | ICD-10-CM | POA: Diagnosis not present
